# Patient Record
Sex: Female | Born: 1950 | Race: White | Hispanic: No | Marital: Single | Smoking: Never smoker
Health system: Southern US, Community
[De-identification: ages and names within clinical notes are randomized; demographics above are authoritative.]

## PROBLEM LIST (undated history)

## (undated) DIAGNOSIS — F419 Anxiety disorder, unspecified: Secondary | ICD-10-CM

## (undated) DIAGNOSIS — G4700 Insomnia, unspecified: Secondary | ICD-10-CM

## (undated) DIAGNOSIS — R011 Cardiac murmur, unspecified: Secondary | ICD-10-CM

## (undated) DIAGNOSIS — I10 Essential (primary) hypertension: Secondary | ICD-10-CM

## (undated) DIAGNOSIS — E039 Hypothyroidism, unspecified: Secondary | ICD-10-CM

## (undated) HISTORY — PX: COSMETIC SURGERY: SHX468

## (undated) HISTORY — PX: OTHER SURGICAL HISTORY: SHX169

## (undated) HISTORY — PX: EYE SURGERY: SHX253

## (undated) HISTORY — PX: AUGMENTATION MAMMAPLASTY: SUR837

---

## 2000-04-30 ENCOUNTER — Encounter: Payer: Self-pay | Admitting: Specialist

## 2000-04-30 ENCOUNTER — Encounter: Admission: RE | Admit: 2000-04-30 | Discharge: 2000-04-30 | Payer: Self-pay | Admitting: Specialist

## 2000-07-13 ENCOUNTER — Encounter: Payer: Self-pay | Admitting: Specialist

## 2000-07-13 ENCOUNTER — Encounter: Admission: RE | Admit: 2000-07-13 | Discharge: 2000-07-13 | Payer: Self-pay | Admitting: Specialist

## 2000-09-24 ENCOUNTER — Emergency Department (HOSPITAL_COMMUNITY): Admission: EM | Admit: 2000-09-24 | Discharge: 2000-09-24 | Payer: Self-pay | Admitting: Emergency Medicine

## 2000-12-07 ENCOUNTER — Encounter: Payer: Self-pay | Admitting: Emergency Medicine

## 2000-12-07 ENCOUNTER — Emergency Department (HOSPITAL_COMMUNITY): Admission: EM | Admit: 2000-12-07 | Discharge: 2000-12-07 | Payer: Self-pay | Admitting: Emergency Medicine

## 2002-11-02 ENCOUNTER — Encounter
Admission: RE | Admit: 2002-11-02 | Discharge: 2003-01-31 | Payer: Self-pay | Admitting: Physical Medicine & Rehabilitation

## 2003-10-15 ENCOUNTER — Encounter: Admission: RE | Admit: 2003-10-15 | Discharge: 2003-10-15 | Payer: Self-pay | Admitting: Pain Medicine

## 2003-11-19 ENCOUNTER — Encounter: Admission: RE | Admit: 2003-11-19 | Discharge: 2003-11-19 | Payer: Self-pay | Admitting: Pain Medicine

## 2004-01-21 ENCOUNTER — Ambulatory Visit: Payer: Self-pay | Admitting: Pain Medicine

## 2004-01-31 ENCOUNTER — Ambulatory Visit: Payer: Self-pay | Admitting: Pain Medicine

## 2004-02-18 ENCOUNTER — Ambulatory Visit: Payer: Self-pay | Admitting: Physician Assistant

## 2004-04-07 ENCOUNTER — Ambulatory Visit: Payer: Self-pay | Admitting: Pain Medicine

## 2004-04-07 ENCOUNTER — Encounter: Admission: RE | Admit: 2004-04-07 | Discharge: 2004-04-07 | Payer: Self-pay | Admitting: Pain Medicine

## 2004-05-26 ENCOUNTER — Ambulatory Visit: Payer: Self-pay | Admitting: Pain Medicine

## 2004-06-17 ENCOUNTER — Ambulatory Visit: Payer: Self-pay | Admitting: Pain Medicine

## 2004-06-23 ENCOUNTER — Ambulatory Visit: Payer: Self-pay | Admitting: Physician Assistant

## 2004-07-23 ENCOUNTER — Ambulatory Visit: Payer: Self-pay | Admitting: Physician Assistant

## 2004-09-13 ENCOUNTER — Emergency Department (HOSPITAL_COMMUNITY): Admission: AD | Admit: 2004-09-13 | Discharge: 2004-09-13 | Payer: Self-pay | Admitting: Family Medicine

## 2004-09-16 ENCOUNTER — Ambulatory Visit: Payer: Self-pay | Admitting: Internal Medicine

## 2004-09-17 ENCOUNTER — Emergency Department (HOSPITAL_COMMUNITY): Admission: EM | Admit: 2004-09-17 | Discharge: 2004-09-17 | Payer: Self-pay | Admitting: Emergency Medicine

## 2004-09-22 ENCOUNTER — Emergency Department (HOSPITAL_COMMUNITY): Admission: EM | Admit: 2004-09-22 | Discharge: 2004-09-22 | Payer: Self-pay | Admitting: Family Medicine

## 2004-10-16 ENCOUNTER — Ambulatory Visit: Payer: Self-pay | Admitting: Internal Medicine

## 2004-11-19 ENCOUNTER — Emergency Department (HOSPITAL_COMMUNITY): Admission: EM | Admit: 2004-11-19 | Discharge: 2004-11-19 | Payer: Self-pay | Admitting: Family Medicine

## 2004-11-19 ENCOUNTER — Ambulatory Visit: Payer: Self-pay | Admitting: Internal Medicine

## 2004-11-24 ENCOUNTER — Emergency Department (HOSPITAL_COMMUNITY): Admission: EM | Admit: 2004-11-24 | Discharge: 2004-11-24 | Payer: Self-pay | Admitting: Emergency Medicine

## 2004-12-22 ENCOUNTER — Emergency Department (HOSPITAL_COMMUNITY): Admission: EM | Admit: 2004-12-22 | Discharge: 2004-12-22 | Payer: Self-pay | Admitting: Family Medicine

## 2004-12-24 ENCOUNTER — Ambulatory Visit: Payer: Self-pay | Admitting: Internal Medicine

## 2004-12-31 ENCOUNTER — Encounter: Payer: Self-pay | Admitting: Internal Medicine

## 2005-02-18 ENCOUNTER — Ambulatory Visit: Payer: Self-pay | Admitting: Internal Medicine

## 2005-03-06 ENCOUNTER — Encounter: Payer: Self-pay | Admitting: Emergency Medicine

## 2005-03-07 ENCOUNTER — Inpatient Hospital Stay (HOSPITAL_COMMUNITY): Admission: EM | Admit: 2005-03-07 | Discharge: 2005-03-11 | Payer: Self-pay | Admitting: Internal Medicine

## 2005-12-15 ENCOUNTER — Observation Stay (HOSPITAL_COMMUNITY): Admission: EM | Admit: 2005-12-15 | Discharge: 2005-12-16 | Payer: Self-pay | Admitting: Emergency Medicine

## 2009-06-16 ENCOUNTER — Inpatient Hospital Stay (HOSPITAL_COMMUNITY): Admission: EM | Admit: 2009-06-16 | Discharge: 2009-06-18 | Payer: Self-pay | Admitting: Emergency Medicine

## 2009-06-16 ENCOUNTER — Ambulatory Visit: Payer: Self-pay | Admitting: Cardiology

## 2009-06-16 ENCOUNTER — Ambulatory Visit: Payer: Self-pay | Admitting: Pulmonary Disease

## 2009-06-16 ENCOUNTER — Encounter: Payer: Self-pay | Admitting: Internal Medicine

## 2009-06-17 ENCOUNTER — Encounter: Payer: Self-pay | Admitting: Cardiology

## 2009-06-18 DIAGNOSIS — F39 Unspecified mood [affective] disorder: Secondary | ICD-10-CM

## 2010-03-31 LAB — CARDIAC PANEL(CRET KIN+CKTOT+MB+TROPI)
CK, MB: 0.9 ng/mL (ref 0.3–4.0)
CK, MB: 1.6 ng/mL (ref 0.3–4.0)
Relative Index: 1.1 (ref 0.0–2.5)
Relative Index: INVALID (ref 0.0–2.5)
Total CK: 36 U/L (ref 7–177)
Total CK: 60 U/L (ref 7–177)
Troponin I: 0.03 ng/mL (ref 0.00–0.06)
Troponin I: 0.05 ng/mL (ref 0.00–0.06)

## 2010-03-31 LAB — RETICULOCYTES
RBC.: 5.25 MIL/uL — ABNORMAL HIGH (ref 3.87–5.11)
Retic Count, Absolute: 110.3 10*3/uL (ref 19.0–186.0)
Retic Ct Pct: 2.1 % (ref 0.4–3.1)

## 2010-03-31 LAB — HEPATIC FUNCTION PANEL
ALT: 301 U/L — ABNORMAL HIGH (ref 0–35)
ALT: 463 U/L — ABNORMAL HIGH (ref 0–35)
AST: 127 U/L — ABNORMAL HIGH (ref 0–37)
AST: 353 U/L — ABNORMAL HIGH (ref 0–37)
Albumin: 2.8 g/dL — ABNORMAL LOW (ref 3.5–5.2)
Alkaline Phosphatase: 103 U/L (ref 39–117)
Alkaline Phosphatase: 117 U/L (ref 39–117)
Bilirubin, Direct: 0.2 mg/dL (ref 0.0–0.3)
Indirect Bilirubin: 0.5 mg/dL (ref 0.3–0.9)
Total Protein: 4.9 g/dL — ABNORMAL LOW (ref 6.0–8.3)

## 2010-03-31 LAB — COMPREHENSIVE METABOLIC PANEL
ALT: 605 U/L — ABNORMAL HIGH (ref 0–35)
AST: 825 U/L — ABNORMAL HIGH (ref 0–37)
Albumin: 3.5 g/dL (ref 3.5–5.2)
Alkaline Phosphatase: 157 U/L — ABNORMAL HIGH (ref 39–117)
BUN: 39 mg/dL — ABNORMAL HIGH (ref 6–23)
CO2: 21 mEq/L (ref 19–32)
Calcium: 10.4 mg/dL (ref 8.4–10.5)
Chloride: 106 mEq/L (ref 96–112)
Creatinine, Ser: 1.95 mg/dL — ABNORMAL HIGH (ref 0.4–1.2)
GFR calc Af Amer: 32 mL/min — ABNORMAL LOW (ref >60)
GFR calc non Af Amer: 26 mL/min — ABNORMAL LOW (ref >60)
Glucose, Bld: 118 mg/dL — ABNORMAL HIGH (ref 70–99)
Potassium: 4.6 mEq/L (ref 3.5–5.1)
Sodium: 136 mEq/L (ref 135–145)
Total Bilirubin: 2.9 mg/dL — ABNORMAL HIGH (ref 0.3–1.2)
Total Protein: 5.9 g/dL — ABNORMAL LOW (ref 6.0–8.3)

## 2010-03-31 LAB — DIFFERENTIAL
Basophils Absolute: 0 10*3/uL (ref 0.0–0.1)
Basophils Relative: 0 % (ref 0–1)
Eosinophils Absolute: 0 10*3/uL (ref 0.0–0.7)
Eosinophils Relative: 0 % (ref 0–5)
Lymphocytes Relative: 27 % (ref 12–46)
Lymphs Abs: 3.2 10*3/uL (ref 0.7–4.0)
Monocytes Absolute: 0.2 10*3/uL (ref 0.1–1.0)
Monocytes Relative: 2 % — ABNORMAL LOW (ref 3–12)
Neutro Abs: 8.5 10*3/uL — ABNORMAL HIGH (ref 1.7–7.7)
Neutrophils Relative %: 71 % (ref 43–77)

## 2010-03-31 LAB — BLOOD GAS, ARTERIAL
Acid-base deficit: 3.4 mmol/L — ABNORMAL HIGH (ref 0.0–2.0)
Drawn by: 29603
PEEP: 5 cmH2O
Patient temperature: 98.6
TCO2: 23.6 mmol/L (ref 0–100)
pCO2 arterial: 47.2 mmHg — ABNORMAL HIGH (ref 35.0–45.0)
pH, Arterial: 7.293 — ABNORMAL LOW (ref 7.350–7.400)

## 2010-03-31 LAB — PROTIME-INR
INR: 1.22 (ref 0.00–1.49)
Prothrombin Time: 15.3 seconds — ABNORMAL HIGH (ref 11.6–15.2)

## 2010-03-31 LAB — GLUCOSE, CAPILLARY: Glucose-Capillary: 233 mg/dL — ABNORMAL HIGH (ref 70–99)

## 2010-03-31 LAB — POCT CARDIAC MARKERS
CKMB, poc: <1 ng/mL — ABNORMAL LOW (ref 1.0–8.0)
Myoglobin, poc: 104 ng/mL (ref 12–200)
Troponin i, poc: <0.05 ng/mL (ref 0.00–0.09)

## 2010-03-31 LAB — CBC
HCT: 27.9 % — ABNORMAL LOW (ref 36.0–46.0)
Hemoglobin: 9.4 g/dL — ABNORMAL LOW (ref 12.0–15.0)
MCHC: 33.7 g/dL (ref 30.0–36.0)
MCV: 82.8 fL (ref 78.0–100.0)
Platelets: 109 10*3/uL — ABNORMAL LOW (ref 150–400)
Platelets: 117 10*3/uL — ABNORMAL LOW (ref 150–400)
RBC: 3.37 MIL/uL — ABNORMAL LOW (ref 3.87–5.11)
RBC: 4.16 MIL/uL (ref 3.87–5.11)
RDW: 13.4 % (ref 11.5–15.5)
WBC: 10.6 10*3/uL — ABNORMAL HIGH (ref 4.0–10.5)
WBC: 12 10*3/uL — ABNORMAL HIGH (ref 4.0–10.5)

## 2010-03-31 LAB — BASIC METABOLIC PANEL
BUN: 24 mg/dL — ABNORMAL HIGH (ref 6–23)
Calcium: 8.5 mg/dL (ref 8.4–10.5)
Creatinine, Ser: 0.76 mg/dL (ref 0.4–1.2)
GFR calc Af Amer: >60 mL/min (ref >60)
GFR calc non Af Amer: >60 mL/min (ref >60)

## 2010-03-31 LAB — IRON AND TIBC
Iron: 240 ug/dL — ABNORMAL HIGH (ref 42–135)
Saturation Ratios: 75 % — ABNORMAL HIGH (ref 20–55)
TIBC: 318 ug/dL (ref 250–470)
UIBC: 78 ug/dL

## 2010-03-31 LAB — RAPID URINE DRUG SCREEN, HOSP PERFORMED
Amphetamines: NOT DETECTED
Barbiturates: NOT DETECTED
Benzodiazepines: POSITIVE — AB
Cocaine: NOT DETECTED
Opiates: NOT DETECTED
Tetrahydrocannabinol: NOT DETECTED

## 2010-03-31 LAB — APTT
aPTT: 26 seconds (ref 24–37)
aPTT: 30 seconds (ref 24–37)

## 2010-03-31 LAB — MAGNESIUM: Magnesium: 2.3 mg/dL (ref 1.5–2.5)

## 2010-03-31 LAB — POCT I-STAT, CHEM 8
BUN: 22 mg/dL (ref 6–23)
Calcium, Ion: 1.1 mmol/L — ABNORMAL LOW (ref 1.12–1.32)
Chloride: 117 mEq/L — ABNORMAL HIGH (ref 96–112)
Creatinine, Ser: 1.5 mg/dL — ABNORMAL HIGH (ref 0.4–1.2)
Glucose, Bld: 90 mg/dL (ref 70–99)
HCT: 24 % — ABNORMAL LOW (ref 36.0–46.0)
Hemoglobin: 8.2 g/dL — ABNORMAL LOW (ref 12.0–15.0)
Potassium: 2.9 mEq/L — ABNORMAL LOW (ref 3.5–5.1)
Sodium: 145 mEq/L (ref 135–145)
TCO2: 10 mmol/L (ref 0–100)

## 2010-03-31 LAB — MRSA PCR SCREENING: MRSA by PCR: POSITIVE — AB

## 2010-03-31 LAB — VITAMIN B12: Vitamin B-12: 1090 pg/mL — ABNORMAL HIGH (ref 211–911)

## 2010-03-31 LAB — DRUG SCREEN PANEL (SERUM)

## 2010-03-31 LAB — LIPID PANEL
Cholesterol: 81 mg/dL (ref 0–200)
LDL Cholesterol: 24 mg/dL (ref 0–99)
VLDL: 24 mg/dL (ref 0–40)

## 2010-03-31 LAB — TSH: TSH: 0.006 u[IU]/mL — ABNORMAL LOW (ref 0.350–4.500)

## 2010-03-31 LAB — FERRITIN: Ferritin: 5700 ng/mL — ABNORMAL HIGH (ref 10–291)

## 2010-03-31 LAB — FOLATE: Folate: 17.3 ng/mL

## 2010-05-30 NOTE — H&P (Signed)
NAMELorel Ward                ACCOUNT NO.:  [id number]   MEDICAL RECORD NO.:  [id number]          PATIENT TYPE:  EMS   LOCATION:  ED                           FACILITY:  [facility]   PHYSICIAN:  Hollice Espy, M.D.DATE OF BIRTH:  02-03-1950   DATE OF ADMISSION:  12/15/2005  DATE OF DISCHARGE:                              HISTORY & PHYSICAL   PRIMARY CARE PHYSICIAN:  C. Duane Lope, M.D.   CHIEF COMPLAINT:  Diarrhea.   HISTORY OF PRESENT ILLNESS:  The patient is a 60 year old white female  with a past medical history of hypertension and depression, who presents  to the emergency room after several days of diarrhea.  She had stated  that she had been unable to keep anything down, and when she had labs  checked, more concerning was a potassium of 8.7 with a normal BUN and  creatinine.  Repeat labs confirmed this elevated level, and the patient  was given 20 units of Regular insulin plus one ampule of D-50, as well  as a dose of sodium bicarbonate and one ampule of calcium chloride.  She  currently is feeling okay.   REVIEW OF SYSTEMS:  She denies any headaches, vision changes, dysphagia,  chest pain, palpitations, shortness of breath, wheezing, coughing,  abdominal pain, hematuria, dysuria or constipation.  No focal extremity  numbness, weakness or pain.  A full review of systems is otherwise  negative.   PAST MEDICAL HISTORY:  1. Hypertension.  2. Depression.   MEDICATIONS:  1. She takes potassium with K-Dur 20 mEq, and according to the patient      she takes four to five pills a day, which is equal to 80 mEq to 100      mEq of potassium.  2. Lisinopril 20 mg p.o. daily.  3. Atenolol p.o. daily.  4. Methadone 10 mg p.o. daily.  5. Cymbalta p.o. daily.   ALLERGIES:  PENICILLIN.   SOCIAL HISTORY:  She denies any tobacco, excessive alcohol or drug use.   FAMILY HISTORY:  Noncontributory.   PHYSICAL EXAMINATION:  VITAL SIGNS:  On admission temperature 97.4  degrees,  heart rate 98, now down to 67, blood pressure 152/101, now down  to 129/89, respirations 22, O2 saturation 99% on room air.  GENERAL:  The patient is alert and oriented x3, in no apparent distress.  HEENT:  Normocephalic and atraumatic.  Mucous membranes are moist.  NECK:  She has no carotid bruits.  HEART:  A regular rate and rhythm.  S1, S2.  A 2/6 systolic ejection  murmur.  LUNGS:  Clear to auscultation bilaterally.  ABDOMEN:  Soft, nontender, non-distended.  Positive bowel sounds.  EXTREMITIES:  No clubbing, cyanosis or edema.   LABORATORY DATA:  A urinalysis shows a trace of hemoglobin.  She has 30  of protein, otherwise normal.  Sodium 134, potassium 8.7, chloride 112,  bicarbonate 20, BUN 24, creatinine 0.9, glucose 127, calcium 9.7.  Urinalysis shows many epithelial cells but only 0-2 red cells.  Confirmed potassium is 8.9.   She has peak T-waves reportedly on her electrocardiogram, although I do  not have it in front of me at this time.   ASSESSMENT/PLAN:  1. Hyperkalemia:  This is likely caused by the patient's excessive use      of potassium.  It is unclear if the patient misunderstood the      dosing, but clearly taking 80 mEq to 100 mEq of potassium daily      will certainly lead to excess.  It is unclear also as to how long      she has been doing this.  She was also noted to be on an ACE      inhibitor, but she is not in any type of renal failure for some      time.  Would favor for now obviously holding it, but I do not see      long-term why she cannot continue on this medication, although I      would likely discontinue her potassium supplement altogether.  Will      start her on Kayexalate.  She has already received a dose of      bicarbonate, calcium carbonate and insulin plus glucose.  Will      repeat levels here shortly, and if need to continue aggressive      insulin and glucose treatments plus albuterol treatments, in      regards to getting her potassium  down further short-term, while      Kayexalate will take its time to work.  Will repeat her labs also      in the morning, and if her labs are better, will possibly consider      discharge.  2. Hypertension:  Again, down the line the patient can resume her ACE      inhibitor.  Will continue on her atenolol, but for now will hold      these medications.  3. Diarrhea, likely gastroenteritis:  The patient has no recent use of      antibiotic, but for now we told her to expect diarrhea with the      Kayexalate.  She appears to be well-hydrated.  Will continue      aggressive IV hydration in attempts to diurese her potassium      further.  4. Depression:  Continue Cymbalta.  5. Chronic back pain:  Continue methadone.      Hollice Espy, M.D.  Electronically Signed     SKK/MEDQ  D:  12/15/2005  T:  12/15/2005  Job:  [id number]   cc:   C. Duane Lope, M.D.  Fax: [phone]

## 2010-05-30 NOTE — Discharge Summary (Signed)
NAMELorel Ward                ACCOUNT NO.:  [id number]   MEDICAL RECORD NO.:  [id number]          PATIENT TYPE:  INP   LOCATION:  3031                         FACILITY:  [facility]   PHYSICIAN:  Lonia Blood, M.D.       DATE OF BIRTH:  1950/08/08   DATE OF ADMISSION:  03/07/2005  DATE OF DISCHARGE:  03/11/2005                                 DISCHARGE SUMMARY   DISCHARGE DIAGNOSES:  1.  Acute on chronic right frontal sinusitis with preseptal cellulitis.  2.  Fibromyalgia.  3.  Depression with anxiety.  4.  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  5.  Vitiligo.  6.  Insomnia.   DISCHARGE MEDICATIONS:  1.  Augmentin 875 mg p.o. b.i.d. for six weeks.  2.  Atenolol 100 mg p.o. daily.  3.  Trazodone 300 mg at bedtime.  4.  Cymbalta 60 mg p.o. daily.  5.  Requip 0.25 mg p.o. at bedtime.  6.  MiraLax 17 gm p.o. daily.  7.  Klonopin 2 mg p.o. at bedtime.  8.  Vicodin 5/500 one to two q.6h. p.r.n. pain.   CONDITION ON DISCHARGE:  [name] was discharged in fair condition.   DISCHARGE FOLLOWUP:  Follow up with Dr. Ezzard Standing, phone number [phone]; she  is also instructed to follow up with primary care physician, Dr. Karilyn Cota.   PROCEDURE DURING THIS ADMISSION:  On March 08, 2005, the patient  underwent functional endoscopic sinus surgery with bilateral  nasal frontal  duct exploration by Dr. Ezzard Standing.   CONSULTATIONS THIS ADMISSION:  Dr. Ezzard Standing from otorhinolaryngology and also  Dr. Orvan Falconer from infectious diseases.   For admission history and physical, please refer to the dictated history and  physical done by Dr. Roxan Hockey on March 07, 2005.   HOSPITAL COURSE:  Problem #1:  Right eye preseptal cellulitis with frontal  sinusitis.  [name] was kept on empiric intravenous vancomycin. She had  computer tomography of her sinuses confirming the presence of worsening  sinus disease. She underwent endoscopic surgery on March 08, 2005, by Dr.  Ezzard Standing. She recovered and because of that we have consulted  infectious  disease service to help with the choice of empiric antibiotics.   Problem #2:  Fibromyalgia, depression, and anxiety. This has been stable  throughout the hospitalization. [name] was kept on her chronic doses of  trazodone, Cymbalta, Requip.   Problem #3:  Chronic constipation. [name] had repeat doses of MiraLax in  the hospital and she also received a Fleet's enema.      Lonia Blood, M.D.  Electronically Signed     SL/MEDQ  D:  03/11/2005  T:  03/11/2005  Job:  [id number]   cc:   Kristine Garbe. Ezzard Standing, M.D.  Fax: [phone]

## 2010-05-30 NOTE — Op Note (Signed)
NAMELorel Ward                ACCOUNT NO.:  [id number]   MEDICAL RECORD NO.:  [id number]          PATIENT TYPE:  INP   LOCATION:  3031                         FACILITY:  [facility]   PHYSICIAN:  Kristine Garbe. 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   OPERATION PERFORMED:  Functional endoscopic sinus surgery utilizing the  Stealth guidance system.  Bilateral nasal frontal duct exploration.  Culture  irrigation of right sinus trephine osteomyelitis.   SURGEON:  Kristine Garbe. Ezzard Standing, M.D.   ANESTHESIA:  General.   COMPLICATIONS:  None.   INDICATIONS FOR PROCEDURE:  Gloria Ward is a 60 year old female who has had  previous sinus surgery x3 15 years ago.  More recently she has been having  problems with headaches and recurrent right periorbital swelling.  This has  responded previously to antibiotics but more recently has had increased  swelling over the right eyelid and developed a purulent discharge just in  the superior aspect of the right eyelid.  CT scan shows a right frontal  sinus osteomyelitis with erosion through the anterior inferior wall of the  frontal sinus.  She also has complete opacification of the left frontal  sinus.  She is taken to the operating room at this time for drainage,  irrigation and culture of the right frontal sinus along with bilateral nasal  frontal duct explorations.   DESCRIPTION OF PROCEDURE:  The patient was brought to the operating room.  The Stealth system was placed on her head and calibrated.  Nose was prepped  with Betadine solution.  Nose was then further prepped with cotton pledgets  soaked in Afrin and the nose was  injected with Xylocaine with epinephrine  for hemostasis.  Using the Stealth system, the lower portion of the nasal  frontal duct was obstructed bilaterally.  The more mid and posterior ethmoid  area were widely patent and I could visualize the ethmoid roof which was  clear of any disease.  Working anteriorly, the anterior ethmoid and nasal  frontal duct area, large thru-cut forceps were used to open up this bone.  The mucosa was very thickened, especially on the right side, she had a very  small narrow nasal frontal duct area.  I was able to pass the curved suction  up the nasal frontal duct area and utilizing the Stealth system, got to the  superior aspect of the nasal frontal duct region bilaterally.  Next, the  fistula tract in the right upper eyelid was explored.  The wound was  enlarged slightly and the opening of the frontal sinus was identified.  Using curets and rongeurs, this opening was enlarged to approximately 1 cm  size.  Cultures were obtained from the purulent drainage.  The sinus that  could be visualized was irrigated with saline but on irrigation it really  was not clear whether  any of this irrigation was getting through down  through the nasal frontal duct region.  After irrigating the sinus, a  Penrose drain was placed with the sinus and sutured to the upper eyelid.  Using the 30 degree scopes, the nose was examined again and the nasal  frontal areas were opened up as far superiorly as possible using the 30  degree scopes.  Kennedy sinus packs were placed within the middle meatus  area bilaterally and hydrated with Xylocaine with epinephrine.  Rachael was  awakened from anesthesia and transferred to recovery room postoperatively  doing well.   DISPOSITION:  Rachael will stay in the hospital for another couple of days  on IV antibiotics.  Will probably plan on keeping the patient on antibiotics  for four to six weeks.  Will plan on removing the Kennedy sinus packs in   five to six days and the Penrose drain at the same time.           ______________________________  Kristine Garbe Ezzard Standing, M.D.     CEN/MEDQ  D:  03/08/2005  T:  03/09/2005  Job:  [id number]

## 2010-05-30 NOTE — Consult Note (Signed)
NAMELorel Ward                ACCOUNT NO.:  [id number]   MEDICAL RECORD NO.:  [id number]          PATIENT TYPE:  INP   LOCATION:  3031                         FACILITY:  [facility]   PHYSICIAN:  Kristine Garbe. Ezzard Standing, M.D.DATE OF BIRTH:  January 10, 1951   DATE OF CONSULTATION:  03/07/2005  DATE OF DISCHARGE:                                   CONSULTATION   REASON FOR CONSULTATION:  Evaluate patient with right frontal sinus  osteomyelitis.   BRIEF HISTORY:  [name] is a 61 year old female who had previous sinus  surgery x3, 15 years ago in [place]. Has had problems with chronic  headaches and more recently has had intermittent swelling of her right eye.  This has been treated with antibiotics and it goes down previously when  treated with antibiotics but over the last week and a half, it has been  increasing and then started draining about a week ago with a purulent  discharge from the right upper eyelid. She was admitted to the hospital for  IV antibiotics and a CT scan demonstrated right frontal sinus osteomyelitis  with a bony defect anteriorly, draining or fistulizing through the right  upper eyelid. She also has complete obstruction of the nasal fold duct  regions bilaterally with opacification of the left frontal sinus as well.   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   RECOMMENDATIONS:  Culture has been obtained. Treat with appropriate  intravenous antibiotics per culture. Will require surgical intervention to  open up the natural nasal frontal duct regions so the sinuses can drain  internally. Will plan on taking the patient to the operating room tomorrow  for functional endoscopic sinus surgery, utilizing the Stealth guidant  system and drainage of the frontal sinuses. I discussed this and risks to  the patient and she agrees with the surgery.           ______________________________  Kristine Garbe Ezzard Standing, M.D.     CEN/MEDQ  D:  03/07/2005  T:  03/08/2005  Job:  [id number]   cc:   Kristine Garbe. Ezzard Standing, M.D.  Fax: [phone]

## 2010-05-30 NOTE — H&P (Signed)
NAMECarmelia Ward NO.:  [id number]   MEDICAL RECORD NO.:  [id number]          PATIENT TYPE:  EMS   LOCATION:  ED                           FACILITY:  [facility]   PHYSICIAN:  Gloria Ward, M.D. DATE OF BIRTH:  06-10-1950   DATE OF ADMISSION:  03/06/2005  DATE OF DISCHARGE:                                HISTORY & PHYSICAL   PRIMARY CARE PHYSICIAN:  Unassigned.   CHIEF COMPLAINT:  Right-sided face pain.   HISTORY OF PRESENT ILLNESS:  Ms. Gloria Ward is a 60 year old female with a past  medical history of hypertension who states that approximately 2 weeks ago  her right eye started to swell.  She went on to state that she has had  problems with this at least twice before.  However, each time the eye would  swell, it would spontaneously improve on its own.  This time is different.  There has been no resolution of her current eye swelling, and, in fact, it  has progressed to the point that the whole right side of her face has  started to swell.  She states that her right eye has closed secondary to the  swelling, and it has been closed for approximately 2 weeks.  She developed  discharge from her tear duct several days ago.  Yesterday she took 2  ciprofloxacin tablets that had been prescribed for something else and were  in her cabinet.  Shortly afterwards, she began to apply pressure to her  right eyelid.  A large amount of pus was released from the upper eyelid.  She also complains of pain throughout the entire right side of her face.  She denies nausea and vomiting but does state that she has felt febrile and  has had chills.  She went to the Urgent Care today and was told to come to  the ER.  She denies any prior trauma to the right eye.   PAST MEDICAL HISTORY:  1.  Right eye swelling 1 month ago.  The patient states that she was seen at      [facility] Emergency Department but states that nothing was done, and      she was sent home and told to follow up at the  Urgent Care.  2.  Ruptured, herniated disk at C6-C7.  3.  Wide bulging disk involving the lumbosacral spine.  4.  Vitiligo.  5.  Fibromyalgia.  6.  Rheumatoid arthritis.  7.  Insomnia.   PAST SURGICAL HISTORY:  1.  C section x2.  2.  Sinus surgery.   ALLERGIES:  1.  PENICILLIN causes the patient to feel hot.  2.  STEROIDS cause swelling.   HOME MEDICATIONS:  1.  Cymbalta.  2.  Tenormin 100 mg p.o. daily.  3.  Prilosec.  4.  Multivitamins.  5.  Lasix.  The patient uses this on a p.r.n. basis.  6.  Antihistamines.  7.  Trazodone 150 mg 3 tablets daily.  8.  Klonopin 3 mg 3 tablets daily.  9.  Soma.   SOCIAL HISTORY:  1.  Cigarettes: The patient smoked  while in her 90s, but she no longer      smokes cigarettes.  2.  Alcohol: The patient denies.   FAMILY HISTORY:  Mother had history of colorectal cancer, breast cancer,  alcoholism.  Father had a history of lung cancer, chronic pancreatitis, and  hypertension.   REVIEW OF SYSTEMS:  As per HPI.   PHYSICAL EXAMINATION:  GENERAL: The patient is awake, cooperative, no  obvious distress.  VITAL SIGNS: Temperature 97.7, blood pressure 135/87, heart rate 67,  respirations 20, O2 saturation 95% on room air.  HEENT:  The right half of the patient's face is slightly erythematous,  primarily the lower half of her face.  The right upper and lower eyelids are  erythematous; both are significantly swollen to the point that the patient  cannot open her eye.  There is a purulent yellow discharge coming from the  crease of the upper right eyelid.  There also appears to be dry residue from  the nasal lacrimal duct.  The patient's left eye is uninvolved; it is  anicteric.  Extraocular movement is intact.  Dentures are present in upper  and lower areas.  No thrush.  NECK:  Supple.  No lymphadenopathy.  Thyroid is not palpable.  CARDIAC: S1, S2 present.  Regular rate and rhythm.  RESPIRATORY: No crackles or wheezes.  ABDOMEN: Soft,  nontender, nondistended.  Positive bowel sounds.  No masses.  EXTREMITIES:  No edema.  NEUROLOGIC: The patient is alert and oriented x3.  Cranial nerves II-XII  intact excluding cranial nerves II, III, IV, and VI involving the right eye;  those cannot be assessed secondary to the swelling and the fact that the eye  was closed shut.  MUSCULOSKELETAL: 5/5 upper and lower extremity strength.   CT of the head and face reveals right preseptal cellulitis, probable  prominent right lacrimal apparatus, possible inflammatory, doubt abscess.  Bony defect in the right superior orbital rim which communicates with the  frontal sinus.  Suspect osteomyelitis with a right frontal ethmoid and right  maxillary sinusitis as well.   Labs: White blood cells 10.3, hemoglobin 12.8, hematocrit 38.2, platelets  240.  Sodium 134, potassium 4.1, chloride 98, CO2 32, BUN 17, creatinine  0.8, glucose 77, calcium 9.   ASSESSMENT AND PLAN:  1.  Right eye cellulitis with probable osteomyelitis and abscess of the      right eyelid.  Will continue empiric IV antibiotics for now.  Will check      cultures of that eyelid and will provide p.r.n. pain medications.  Will      also consult ENT.  2.  Hypertension. This is currently stable.  Will continue atenolol.  3.  History of fibromyalgia.  Will monitor this for now.  4.  History of rheumatoid arthritis.  The patient has no current complaints;      therefore, will monitor.  5.  Gastrointestinal prophylaxis.  Will provide Protonix.      Gloria Ward, M.D.  Electronically Signed     OR/MEDQ  D:  03/07/2005  T:  03/07/2005  Job:  [id number]

## 2011-06-02 IMAGING — CR DG CHEST 1V PORT
1 series · 1 of 1 positions shown · non-contrast
Comparison: None.

CLINICAL DATA: Endotracheal tube and C L placement

PORTABLE CHEST - 1 VIEW

[AP]
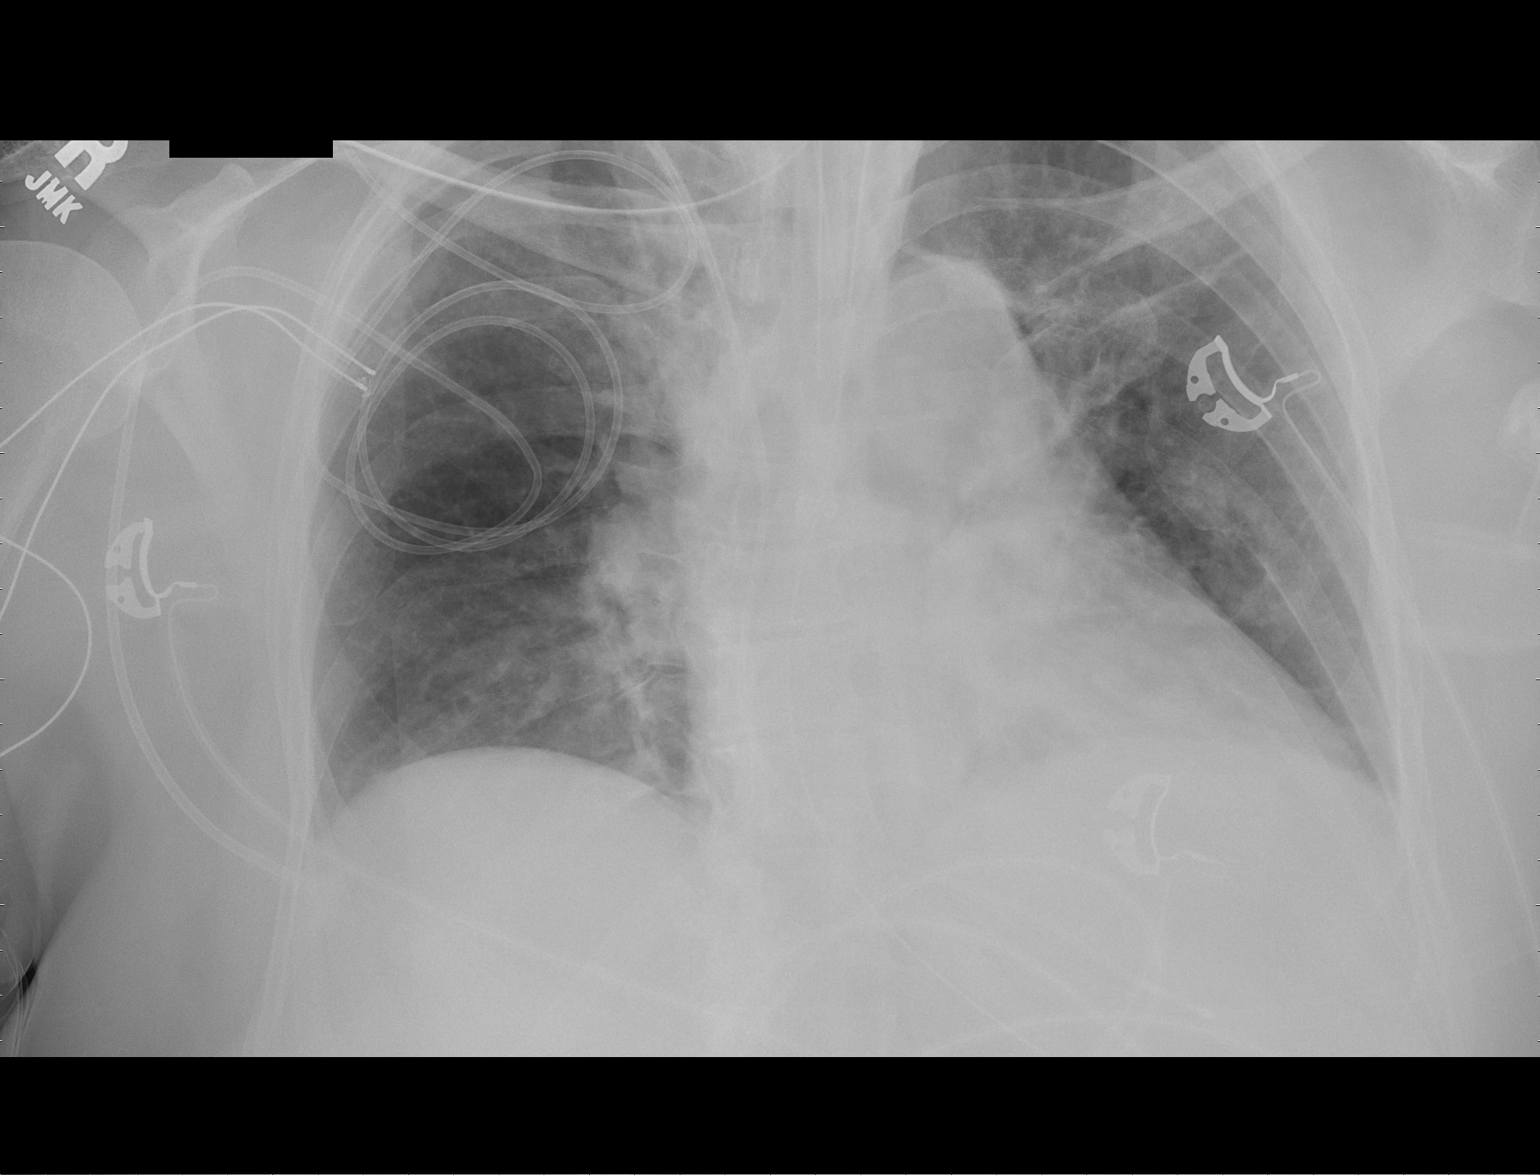

[1 of 1 positions shown; findings below may reference images not displayed]

FINDINGS: Endotracheal tube is appropriately positioned.
Nasogastric tube terminates below the level of the diaphragms but
the tip is not included on the film. Right IJ approach presumed
pacer lead terminates over the expected location of the right
ventricle.  Mild cardiomegaly noted. Lung volumes are low with
crowding of the bronchovascular markings. Retrocardiac opacity may
represent atelectasis, less likely pneumonia given the clinical
history. No pneumothorax.
IMPRESSION: Support apparatus as above.

## 2011-06-03 IMAGING — CR DG CHEST 1V PORT
1 series · 1 of 1 positions shown · non-contrast
Comparison: 1 day prior

CLINICAL DATA: Respiratory failure.  Heart block.

PORTABLE CHEST - 1 VIEW

[AP]
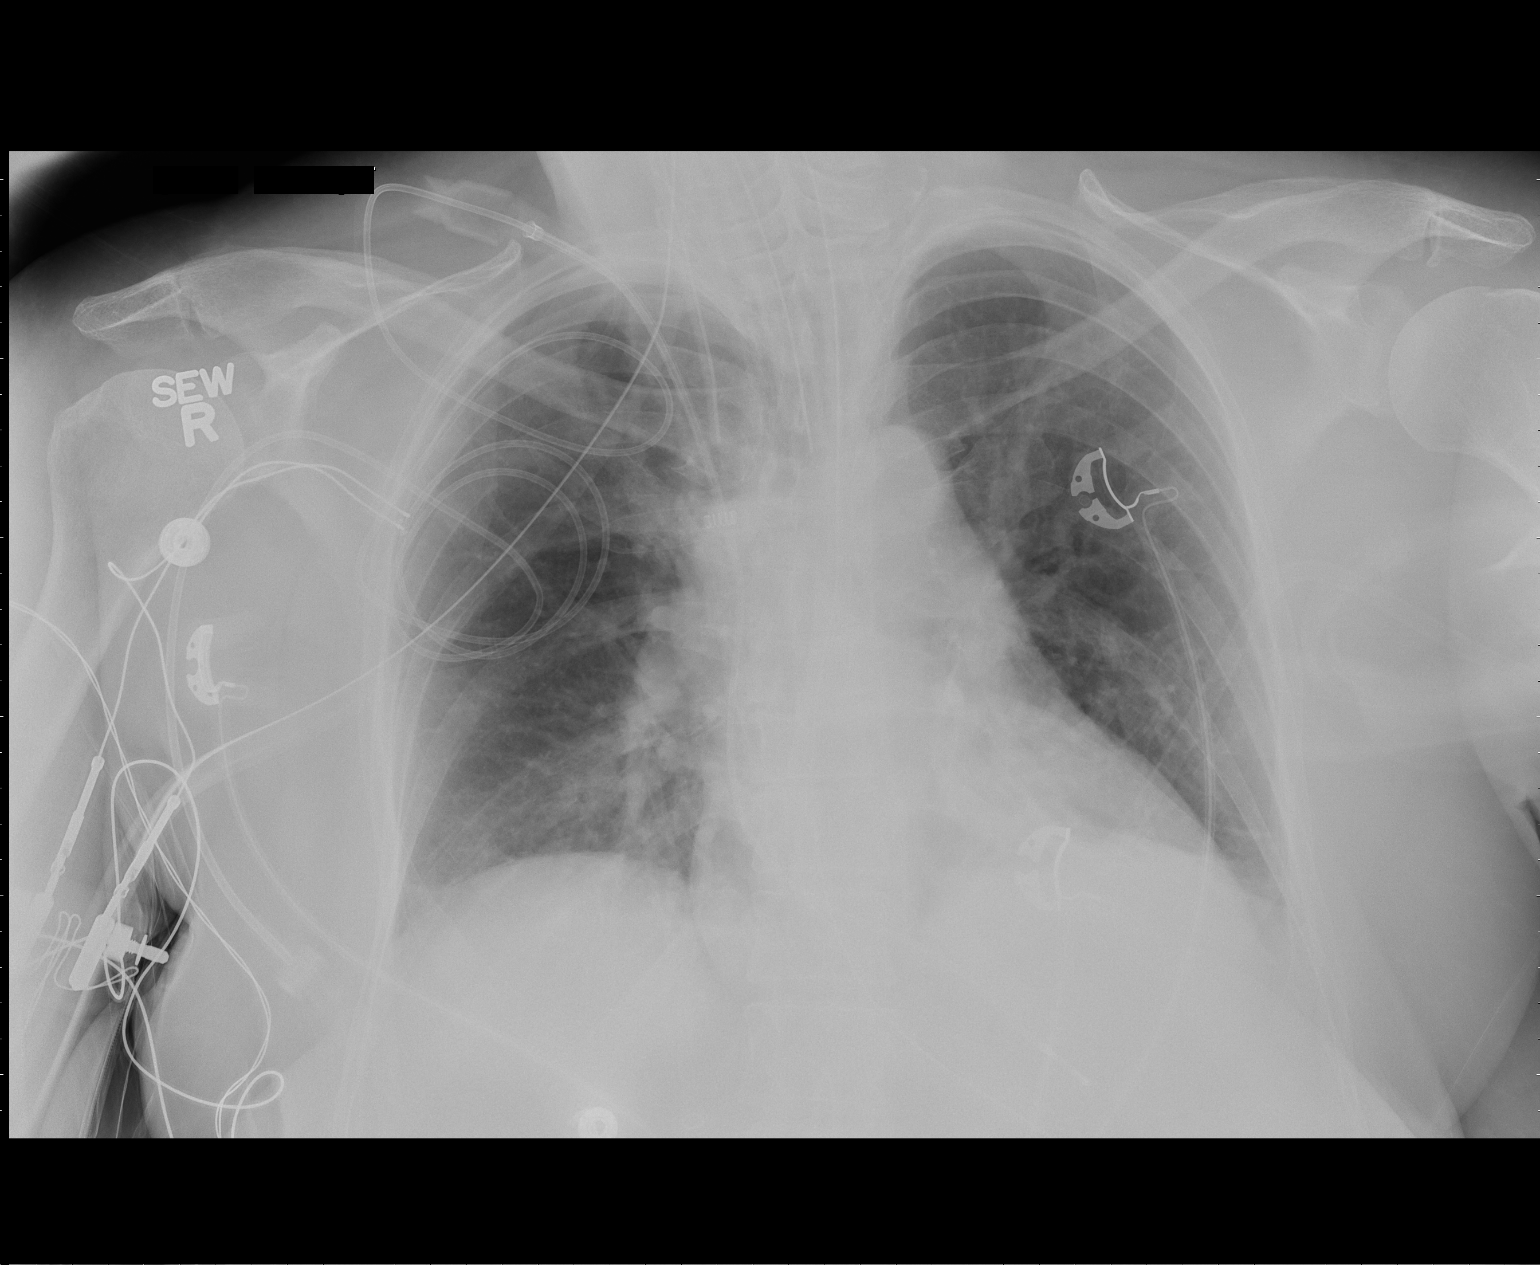

[1 of 1 positions shown; findings below may reference images not displayed]

FINDINGS: Endotracheal tube is unchanged.  Nasogastric tube extends
beyond the  inferior aspect of the film.  A single lead pacer
terminates over the right ventricle.

Midline trachea.  Normal heart size.  No pleural effusion or
pneumothorax.  No congestive failure.

Lung volumes are low.  Mild patchy bibasilar airspace disease is
similar.
IMPRESSION: Low lung volumes.  Similar bibasilar airspace disease, likely
atelectasis.

## 2011-09-17 ENCOUNTER — Other Ambulatory Visit: Payer: Self-pay | Admitting: Obstetrics and Gynecology

## 2011-09-17 DIAGNOSIS — Z1231 Encounter for screening mammogram for malignant neoplasm of breast: Secondary | ICD-10-CM

## 2011-10-01 ENCOUNTER — Ambulatory Visit
Admission: RE | Admit: 2011-10-01 | Discharge: 2011-10-01 | Disposition: A | Discharge status: Home / Self Care | Payer: 59 | Source: Ambulatory Visit | Attending: Obstetrics and Gynecology | Admitting: Obstetrics and Gynecology

## 2011-10-01 DIAGNOSIS — Z1231 Encounter for screening mammogram for malignant neoplasm of breast: Secondary | ICD-10-CM

## 2011-11-17 ENCOUNTER — Encounter (HOSPITAL_COMMUNITY): Payer: Self-pay | Admitting: Pharmacist

## 2011-11-24 ENCOUNTER — Encounter (HOSPITAL_COMMUNITY)
Admission: RE | Admit: 2011-11-24 | Discharge: 2011-11-24 | Disposition: A | Discharge status: Home / Self Care | Payer: 59 | Source: Ambulatory Visit | Attending: Obstetrics and Gynecology | Admitting: Obstetrics and Gynecology

## 2011-11-24 ENCOUNTER — Encounter (HOSPITAL_COMMUNITY): Payer: Self-pay

## 2011-11-24 HISTORY — DX: Insomnia, unspecified: G47.00

## 2011-11-24 HISTORY — DX: Cardiac murmur, unspecified: R01.1

## 2011-11-24 HISTORY — DX: Hypothyroidism, unspecified: E03.9

## 2011-11-24 HISTORY — DX: Essential (primary) hypertension: I10

## 2011-11-24 HISTORY — DX: Anxiety disorder, unspecified: F41.9

## 2011-11-24 NOTE — Patient Instructions (Addendum)
   Your procedure is scheduled on: Monday, Nov 18 at 9 am  Enter through the [employer] [facility] at: 730 am  Pick up the phone at the desk and dial [phone] and inform us of your arrival.  Please call this number if you have any problems the morning of surgery: [phone]  Remember: Do not eat food after midnight: Sunday Do not drink clear liquids after: midnight Sunday Take these medicines the morning of surgery with a SIP OF WATER:  BP and thyroid med  Do not wear jewelry, make-up, or FINGER nail polish No metal in your hair or on your body. Do not wear lotions, powders, perfumes. You may wear deodorant.  Please use your CHG wash as directed prior to surgery.  Do not shave anywhere for at least 12 hours prior to first CHG shower.  Do not bring valuables to the hospital. Contacts, dentures or bridgework may not be worn into surgery.  Patients discharged on the day of surgery will not be allowed to drive home.  Home with son Glean Salvo or daughter Florentina Addison.

## 2011-11-27 NOTE — H&P (Addendum)
Gloria Ward is an 61 y.o. female.   Chief Complaint: bleeding  HPI: 61 yo DWF G4P2 with several year h/o menopausal bleedingg, about 2-4 times per year since age 41.  For the last 1 1/2 years, she has been on HRT with E2  0.5 mg and provera 2.5 mg per day.  PUS/SHSG showed 6 m probable polyp arising from posterior wall.    Past Medical History  Diagnosis Date  . Hypothyroidism   . Hypertension   . Anxiety   . Insomnia   . Heart murmur     no prob - no med    Past Surgical History  Procedure Date  . Eye surgery     right eye  . Dental implants   C/S times two Knee arthroscopy Left 2005  No family history on file. Social History:  reports that she has never smoked. She has never used smokeless tobacco. She reports that she does not drink alcohol or use illicit drugs.Pt is an [employer] fundraiser  Allergies:  Allergies  Allergen Reactions  . Penicillins Rash    Rash and hot all over. Has tolerated Keflex    No prescriptions prior to admission    No results found for this or any previous visit (from the past 48 hour(s)). No results found.  Review of Systems  All other systems reviewed and are negative.    Height 5\' 2"  (1.575 m), weight 69.854 kg (154 lb). Physical Exam  Constitutional: She is oriented to person, place, and time. She appears well-developed and well-nourished.  HENT:  Head: Normocephalic and atraumatic.  Eyes: Conjunctivae normal are normal.  Neck: Normal range of motion. No thyromegaly present.  Cardiovascular: Normal rate and regular rhythm.   Respiratory: Effort normal and breath sounds normal.  GI: Soft. Bowel sounds are normal. She exhibits no distension.  Genitourinary: Vagina normal and uterus normal.  Musculoskeletal: Normal range of motion.  Neurological: She is alert and oriented to person, place, and time.  Skin: Skin is warm and dry.       Diffuse depigmentation consistent with tinea versicolor  Psychiatric: She has a normal mood and affect.      Assessment/Plan Menopausal bleeding.  Plan Hysteroscopy, resection of polyp, D & C  Ilda Laskin P 11/27/2011, 5:04 PM

## 2011-11-30 ENCOUNTER — Encounter (HOSPITAL_COMMUNITY): Payer: Self-pay | Admitting: Anesthesiology

## 2011-11-30 ENCOUNTER — Ambulatory Visit (HOSPITAL_COMMUNITY)
Admission: RE | Admit: 2011-11-30 | Discharge: 2011-11-30 | Disposition: A | Discharge status: Home / Self Care | Payer: 59 | Source: Ambulatory Visit | Attending: Obstetrics and Gynecology | Admitting: Obstetrics and Gynecology

## 2011-11-30 ENCOUNTER — Ambulatory Visit (HOSPITAL_COMMUNITY): Payer: 59 | Admitting: Anesthesiology

## 2011-11-30 ENCOUNTER — Encounter (HOSPITAL_COMMUNITY): Payer: Self-pay | Admitting: *Deleted

## 2011-11-30 ENCOUNTER — Encounter (HOSPITAL_COMMUNITY)
Admission: RE | Disposition: A | Discharge status: Home / Self Care | Payer: Self-pay | Source: Ambulatory Visit | Attending: Obstetrics and Gynecology

## 2011-11-30 DIAGNOSIS — N950 Postmenopausal bleeding: Secondary | ICD-10-CM | POA: Insufficient documentation

## 2011-11-30 DIAGNOSIS — N840 Polyp of corpus uteri: Secondary | ICD-10-CM | POA: Insufficient documentation

## 2011-11-30 HISTORY — PX: DILATATION & CURRETTAGE/HYSTEROSCOPY WITH RESECTOCOPE: SHX5572

## 2011-11-30 LAB — CBC
MCV: 89.3 fL (ref 78.0–100.0)
Platelets: 152 10*3/uL (ref 150–400)
RDW: 13.1 % (ref 11.5–15.5)
WBC: 7.2 10*3/uL (ref 4.0–10.5)

## 2011-11-30 LAB — SURGICAL PCR SCREEN: Staphylococcus aureus: POSITIVE — AB

## 2011-11-30 SURGERY — DILATATION & CURETTAGE/HYSTEROSCOPY WITH RESECTOCOPE
Anesthesia: General | Site: Uterus | Wound class: Contaminated

## 2011-11-30 MED ORDER — FENTANYL CITRATE 0.05 MG/ML IJ SOLN
INTRAMUSCULAR | Status: AC
  Filled 2011-11-30: qty 5

## 2011-11-30 MED ORDER — PROPOFOL 10 MG/ML IV EMUL
INTRAVENOUS | Status: AC
  Filled 2011-11-30: qty 20

## 2011-11-30 MED ORDER — LACTATED RINGERS IV SOLN
INTRAVENOUS | Status: DC
  Administered 2011-11-30 (×2): via INTRAVENOUS

## 2011-11-30 MED ORDER — LIDOCAINE HCL (CARDIAC) 20 MG/ML IV SOLN
INTRAVENOUS | Status: DC | PRN
  Administered 2011-11-30: 30 mg via INTRAVENOUS

## 2011-11-30 MED ORDER — MIDAZOLAM HCL 2 MG/2ML IJ SOLN
INTRAMUSCULAR | Status: AC
  Filled 2011-11-30: qty 2

## 2011-11-30 MED ORDER — KETOROLAC TROMETHAMINE 30 MG/ML IJ SOLN
INTRAMUSCULAR | Status: DC | PRN
  Administered 2011-11-30: 30 mg via INTRAVENOUS

## 2011-11-30 MED ORDER — FENTANYL CITRATE 0.05 MG/ML IJ SOLN
INTRAMUSCULAR | Status: DC | PRN
  Administered 2011-11-30 (×2): 100 ug via INTRAVENOUS

## 2011-11-30 MED ORDER — ONDANSETRON HCL 4 MG/2ML IJ SOLN
INTRAMUSCULAR | Status: DC | PRN
  Administered 2011-11-30: 4 mg via INTRAVENOUS

## 2011-11-30 MED ORDER — ONDANSETRON HCL 4 MG/2ML IJ SOLN
INTRAMUSCULAR | Status: AC
  Filled 2011-11-30: qty 2

## 2011-11-30 MED ORDER — PROPOFOL 10 MG/ML IV EMUL
INTRAVENOUS | Status: DC | PRN
  Administered 2011-11-30 (×5): 50 mg via INTRAVENOUS

## 2011-11-30 MED ORDER — GLYCINE 1.5 % IR SOLN
Status: DC | PRN
  Administered 2011-11-30: 3000 mL

## 2011-11-30 MED ORDER — MIDAZOLAM HCL 5 MG/5ML IJ SOLN
INTRAMUSCULAR | Status: DC | PRN
  Administered 2011-11-30: 2 mg via INTRAVENOUS

## 2011-11-30 MED ORDER — LIDOCAINE HCL (CARDIAC) 20 MG/ML IV SOLN
INTRAVENOUS | Status: AC
  Filled 2011-11-30: qty 5

## 2011-11-30 MED ORDER — FENTANYL CITRATE 0.05 MG/ML IJ SOLN
INTRAMUSCULAR | Status: AC
  Administered 2011-11-30: 50 ug via INTRAVENOUS
  Filled 2011-11-30: qty 2

## 2011-11-30 MED ORDER — MUPIROCIN 2 % EX OINT
TOPICAL_OINTMENT | CUTANEOUS | Status: AC
  Filled 2011-11-30: qty 22

## 2011-11-30 MED ORDER — LIDOCAINE HCL 1 % IJ SOLN
INTRAMUSCULAR | Status: DC | PRN
  Administered 2011-11-30: 20 mL

## 2011-11-30 MED ORDER — METOCLOPRAMIDE HCL 5 MG/ML IJ SOLN: 10.0000 mg | Freq: Once | INTRAMUSCULAR | Status: DC | PRN

## 2011-11-30 MED ORDER — MEPERIDINE HCL 25 MG/ML IJ SOLN: 6.2500 mg | INTRAMUSCULAR | Status: DC | PRN

## 2011-11-30 MED ORDER — FENTANYL CITRATE 0.05 MG/ML IJ SOLN
25.0000 ug | INTRAMUSCULAR | Status: DC | PRN
  Administered 2011-11-30 (×2): 50 ug via INTRAVENOUS

## 2011-11-30 SURGICAL SUPPLY — 18 items
CANISTER SUCTION 2500CC (MISCELLANEOUS) ×2 IMPLANT
CATH ROBINSON RED A/P 16FR (CATHETERS) ×2 IMPLANT
CONTAINER PREFILL 10% NBF 60ML (FORM) ×3 IMPLANT
CORD ACTIVE DISPOSABLE (ELECTRODE) ×1
CORD ELECTRO ACTIVE DISP (ELECTRODE) ×1 IMPLANT
DRESSING TELFA 8X3 (GAUZE/BANDAGES/DRESSINGS) ×2 IMPLANT
ELECT LOOP GYNE PRO 24FR (CUTTING LOOP) ×2
ELECT REM PT RETURN 9FT ADLT (ELECTROSURGICAL) ×2
ELECTRODE LOOP GYNE PRO 24FR (CUTTING LOOP) IMPLANT
ELECTRODE REM PT RTRN 9FT ADLT (ELECTROSURGICAL) ×1 IMPLANT
GLOVE BIOGEL PI IND STRL 7.0 (GLOVE) ×2 IMPLANT
GLOVE BIOGEL PI INDICATOR 7.0 (GLOVE) ×2
GLOVE ECLIPSE 6.5 STRL STRAW (GLOVE) ×2 IMPLANT
GOWN STRL REIN XL XLG (GOWN DISPOSABLE) ×6 IMPLANT
PACK HYSTEROSCOPY LF (CUSTOM PROCEDURE TRAY) ×2 IMPLANT
PAD OB MATERNITY 4.3X12.25 (PERSONAL CARE ITEMS) ×2 IMPLANT
TOWEL OR 17X24 6PK STRL BLUE (TOWEL DISPOSABLE) ×4 IMPLANT
WATER STERILE IRR 1000ML POUR (IV SOLUTION) ×2 IMPLANT

## 2011-11-30 NOTE — Anesthesia Postprocedure Evaluation (Signed)
  Anesthesia Post-op Note  Patient: Gloria Ward  Procedure(s) Performed: Procedure(s) (LRB) with comments: DILATATION & CURETTAGE/HYSTEROSCOPY WITH RESECTOCOPE (N/A)  Patient Location: PACU  Anesthesia Type:General  Level of Consciousness: awake, alert  and oriented  Airway and Oxygen Therapy: Patient Spontanous Breathing  Post-op Pain: none  Post-op Assessment: Post-op Vital signs reviewed, Patient's Cardiovascular Status Stable, Respiratory Function Stable, Patent Airway, No signs of Nausea or vomiting and Pain level controlled  Post-op Vital Signs: Reviewed and stable  Complications: No apparent anesthesia complications

## 2011-11-30 NOTE — Discharge Instructions (Signed)
Hysteroscopy Hysteroscopy is a procedure used for looking inside the womb (uterus). It may be done for many different reasons, including:  To evaluate abnormal bleeding, fibroid (benign, noncancerous) tumors, polyps, scar tissue (adhesions), and possibly cancer of the uterus.  To look for lumps (tumors) and other uterine growths.  To look for causes of why a woman cannot get pregnant (infertility), causes of recurrent loss of pregnancy (miscarriages), or a lost intrauterine device (IUD).  To perform a sterilization by blocking the fallopian tubes from inside the uterus. A hysteroscopy should be done right after a menstrual period to be sure you are not pregnant. LET YOUR CAREGIVER KNOW ABOUT:   Allergies.  Medicines taken, including herbs, eyedrops, over-the-counter medicines, and creams.  Use of steroids (by mouth or creams).  Previous problems with anesthetics or numbing medicines.  History of bleeding or blood problems.  History of blood clots.  Possibility of pregnancy, if this applies.  Previous surgery.  Other health problems. RISKS AND COMPLICATIONS   Putting a hole in the uterus.  Excessive bleeding.  Infection.  Damage to the cervix.  Injury to other organs.  Allergic reaction to medicines.  Too much fluid used in the uterus for the procedure. BEFORE THE PROCEDURE   Do not take aspirin or blood thinners for a week before the procedure, or as directed. It can cause bleeding.  Arrive at least 60 minutes before the procedure or as directed to read and sign the necessary forms.  Arrange for someone to take you home after the procedure.  If you smoke, do not smoke for 2 weeks before the procedure. PROCEDURE   Your caregiver may give you medicine to relax you. He or she may also give you a medicine that numbs the area around the cervix (local anesthetic) or a medicine that makes you sleep (general anesthesia).  Sometimes, a medicine is placed in the cervix  the day before the procedure. This medicine makes the cervix have a larger opening (dilate). This makes it easier for the instrument to be inserted into the uterus.  A small instrument (hysteroscope) is inserted through the vagina into the uterus. This instrument is similar to a pencil-sized telescope with a light.  During the procedure, air or a liquid is put into the uterus, which allows the surgeon to see better.  Sometimes, tissue is gently scraped from inside the uterus. These tissue samples are sent to a specialist who looks at tissue samples (pathologist). The pathologist will give a report to your caregiver. This will help your caregiver decide if further treatment is necessary. The report will also help your caregiver decide on the best treatment if the test comes back abnormal. AFTER THE PROCEDURE   If you had a general anesthetic, you may be groggy for a couple hours after the procedure.  If you had a local anesthetic, you will be advised to rest at the surgical center or caregiver's office until you are stable and feel ready to go home.  You may have some cramping for a couple days.  You may have bleeding, which varies from light spotting for a few days to menstrual-like bleeding for up to 3 to 7 days. This is normal.  Have someone take you home. FINDING OUT THE RESULTS OF YOUR TEST Not all test results are available during your visit. If your test results are not back during the visit, make an appointment with your caregiver to find out the results. Do not assume everything is normal if you  have not heard from your caregiver or the medical facility. It is important for you to follow up on all of your test results. HOME CARE INSTRUCTIONS   Do not drive for 24 hours or as instructed.  Only take over-the-counter or prescription medicines for pain, discomfort, or fever as directed by your caregiver.  Do not take aspirin. It can cause or aggravate bleeding.  Do not drive or drink  alcohol while taking pain medicine.  You may resume your usual diet.  Do not use tampons, douche, or have sexual intercourse for 2 weeks, or as advised by your caregiver.  Rest and sleep for the first 24 to 48 hours.  Take your temperature twice a day for 4 to 5 days. Write it down. Give these temperatures to your caregiver if they are abnormal (above 98.6 F or 37.0 C).  Take medicines your caregiver has ordered as directed.  Follow your caregiver's advice regarding diet, exercise, lifting, driving, and general activities.  Take showers instead of baths for 2 weeks, or as recommended by your caregiver.  If you develop constipation:  Take a mild laxative with the advice of your caregiver.  Eat bran foods.  Drink enough water and fluids to keep your urine clear or pale yellow.  Try to have someone with you or available to you for the first 24 to 48 hours, especially if you had a general anesthetic.  Make sure you and your family understand everything about your operation and recovery.  Follow your caregiver's advice regarding follow-up appointments and Pap smears. SEEK MEDICAL CARE IF:   You feel dizzy or lightheaded.  You feel sick to your stomach (nauseous).  You develop abnormal vaginal discharge.  You develop a rash.  You have an abnormal reaction or allergy to your medicine.  You need stronger pain medicine. SEEK IMMEDIATE MEDICAL CARE IF:   Bleeding is heavier than a normal menstrual period or you have blood clots.  You have an oral temperature above 102 F (38.9 C), not controlled by medicine.  You have increasing cramps or pains not relieved with medicine.  You develop belly (abdominal) pain that does not seem to be related to the same area of earlier cramping and pain.  You pass out.  You develop pain in the tops of your shoulders (shoulder strap areas).  You develop shortness of breath. MAKE SURE YOU:   Understand these instructions.  Will watch  your condition.  Will get help right away if you are not doing well or get worse. Document Released: 04/06/2000 Document Revised: 03/23/2011 Document Reviewed: 07/30/2008 [facility] Patient Information 2013 [place], [place].     Dilation and Curettage or Vacuum Curettage Dilation and curettage (D&C) and vacuum curettage are minor procedures. A D&C involves stretching (dilation) the cervix and scraping (curettage) the inside lining of the womb (uterus). During a D&C, tissue is gently scraped from the inside lining of the uterus. During a vacuum curettage, the lining and tissue in the uterus are removed with the use of gentle suction. Curettage may be performed for diagnostic or therapeutic purposes. As a diagnostic procedure, curettage is performed for the purpose of examining tissues from the uterus. Tissue examination may help determine causes or treatment options for symptoms. A diagnostic curettage may be performed for the following symptoms:  Irregular bleeding in the uterus.  Bleeding with the development of clots.  Spotting between menstrual periods.  Prolonged menstrual periods.  Bleeding after menopause.  No menstrual period (amenorrhea).  A change in  size and shape of the uterus. A therapeutic curettage is performed to remove tissue, blood, or a contraceptive device. Therapeutic curettage may be performed for the following conditions:   Removal of an IUD (intrauterine device).  Removal of retained placenta after giving birth. Retained placenta can cause bleeding severe enough to require transfusions or an infection.  Abortion.  Miscarriage.  Removal of polyps inside the uterus.  Removal of uncommon types of fibroids (noncancerous lumps). LET YOUR CAREGIVER KNOW ABOUT:   Allergies to food or medicine.  Medicines taken, including vitamins, herbs, eyedrops, over-the-counter medicines, and creams.  Use of steroids (by mouth or creams).  Previous problems with  anesthetics or numbing medicines.  History of bleeding problems or blood clots.  Previous surgery.  Other health problems, including diabetes and kidney problems.  Possibility of pregnancy, if this applies. RISKS AND COMPLICATIONS   Excessive bleeding.  Infection of the uterus.  Damage to the cervix.  Development of scar tissue (adhesions) inside the uterus, later causing abnormal amounts of menstrual bleeding.  Complications from the general anesthetic, if a general anesthetic is used.  Putting a hole (perforation) in the uterus. This is rare. BEFORE THE PROCEDURE   Eat and drink before the procedure only as directed by your caregiver.  Arrange for someone to take you home. PROCEDURE   This procedure may be done in a hospital, outpatient clinic, or caregiver's office.  You may be given a general anesthetic or a local anesthetic in and around the cervix.  You will lie on your back with your legs in stirrups.  There are two ways in which your cervix can be softened and dilated. These include:  Taking a medicine.  Having thin rods (laminaria) inserted into your cervix.  A curved tool (curette) will scrape cells from the inside lining of the uterus and will then be removed. This procedure usually takes about 15 to 30 minutes. AFTER THE PROCEDURE   You will rest in the recovery area until you are stable and are ready to go home.  You will need to have someone take you home.  You may feel sick to your stomach (nauseous) or throw up (vomit) if you had general anesthesia.  You may have a sore throat if a tube was placed in your throat during general anesthesia.  You may have light cramping and bleeding for 2 days to 2 weeks after the procedure.  Your uterus needs to make a new lining after the procedure. This may make your next period late. Document Released: 12/29/2004 Document Revised: 03/23/2011 Document Reviewed: 07/27/2008 [facility] [place] [facility] Patient Information 2013  [place], [place].

## 2011-11-30 NOTE — Anesthesia Preprocedure Evaluation (Addendum)
Anesthesia Evaluation  Patient identified by MRN, date of birth, ID band Patient awake    Reviewed: Allergy & Precautions, H&P , NPO status , Patient's Chart, lab work & pertinent test results  Airway Mallampati: II TM Distance: >3 FB Neck ROM: Full    Dental  (+) Teeth Intact, Upper Dentures and Lower Dentures   Pulmonary neg pulmonary ROS,  breath sounds clear to auscultation  Pulmonary exam normal       Cardiovascular hypertension, Pt. on medications + Valvular Problems/Murmurs Rhythm:Regular Rate:Normal     Neuro/Psych Anxiety negative neurological ROS     GI/Hepatic   Endo/Other  Hypothyroidism   Renal/GU   negative genitourinary   Musculoskeletal negative musculoskeletal ROS (+)   Abdominal   Peds  Hematology negative hematology ROS (+)   Anesthesia Other Findings   Reproductive/Obstetrics PMB Endometrial polyp                          Anesthesia Physical Anesthesia Plan  ASA: II  Anesthesia Plan: MAC   Post-op Pain Management:    Induction:   Airway Management Planned: Natural Airway  Additional Equipment:   Intra-op Plan:   Post-operative Plan: Extubation in OR  Informed Consent: I have reviewed the patients History and Physical, chart, labs and discussed the procedure including the risks, benefits and alternatives for the proposed anesthesia with the patient or authorized representative who has indicated his/her understanding and acceptance.   Dental advisory given  Plan Discussed with: CRNA, Anesthesiologist and Surgeon  Anesthesia Plan Comments: (Patient requests sedation with paracervical block.)       Anesthesia Quick Evaluation

## 2011-11-30 NOTE — Transfer of Care (Signed)
Immediate Anesthesia Transfer of Care Note  Patient: Gloria Ward  Procedure(s) Performed: Procedure(s) (LRB) with comments: DILATATION & CURETTAGE/HYSTEROSCOPY WITH RESECTOCOPE (N/A)  Patient Location: PACU  Anesthesia Type:MAC  Level of Consciousness: awake, alert  and oriented  Airway & Oxygen Therapy: Patient Spontanous Breathing  Post-op Assessment: Report given to PACU RN and Post -op Vital signs reviewed and stable  Post vital signs: stable  Complications: No apparent anesthesia complications

## 2011-11-30 NOTE — Interval H&P Note (Signed)
History and Physical Interval Note:  11/30/2011 8:47 AM  Gloria Ward  has presented today for surgery, with the diagnosis of PMB  The various methods of treatment have been discussed with the patient and family. After consideration of risks, benefits and other options for treatment, the patient has consented to  Procedure(s) (LRB) with comments: DILATATION & CURETTAGE/HYSTEROSCOPY WITH RESECTOCOPE (N/A) as a surgical intervention .  The patient's history has been reviewed, patient examined, no change in status, stable for surgery.  I have reviewed the patient's chart and labs.  Questions were answered to the patient's satisfaction.     Saida Lonon P

## 2011-11-30 NOTE — Op Note (Signed)
Preoperative diagnosis: Postmenopausal bleeding, small endometrial polyp Postoperative diagnosis: Same, path pending  Procedure: Hysteroscopy with resection of endometrial polyp, D&C Surgeon: Dr. Aram Beecham Jaxxson Cavanah  anesthesia: Local with IV sedation Glycine deficit: 70 cc Complications: None Procedure: The patient was taken to the operating room and after induction of adequate IV sedation per anesthesia was placed in the dorsolithotomy position and prepped and draped in usual fashion. the bladder was drained with a red rubber catheter.  A posterior weighted and anterior sims retractor were placed and the cervix was grasped on its anterior lip with a single-tooth tenaculum.  Paracervical block was instituted by injecting 10 cc of 1% Xylocaine at each of 3 and 9:00.  The uterus then sounded to 7 m.  The cervix was dilated to #31 Shawnie Pons.  The operative hysteroscope was introduced using glycine as a distention medium with the pressure pump set at 80 mm of mercury.  Hysteroscopy revealed the endometrium to look quite atrophic except there was a small polyp emanating from the posterior wall on the patient's right.  Single loop cautery was used to resect the polyp.  The scope was then withdrawn.  Gentle sharp curettage was then done, and the specimen was sent to pathology.  Instruments were withdrawn from the vagina and the procedure was terminated.  Sponge needle and instrument counts were correct.  The patient was taken to the recovery room in satisfactory condition.

## 2011-12-01 ENCOUNTER — Encounter (HOSPITAL_COMMUNITY): Payer: Self-pay | Admitting: Obstetrics and Gynecology

## 2011-12-22 ENCOUNTER — Other Ambulatory Visit (HOSPITAL_COMMUNITY): Payer: Self-pay | Admitting: Family Medicine

## 2011-12-22 ENCOUNTER — Ambulatory Visit (HOSPITAL_COMMUNITY)
Admission: RE | Admit: 2011-12-22 | Discharge: 2011-12-22 | Disposition: A | Discharge status: Home / Self Care | Payer: 59 | Source: Ambulatory Visit | Attending: Family Medicine | Admitting: Family Medicine

## 2011-12-22 DIAGNOSIS — R609 Edema, unspecified: Secondary | ICD-10-CM

## 2011-12-22 DIAGNOSIS — M79609 Pain in unspecified limb: Secondary | ICD-10-CM

## 2011-12-22 DIAGNOSIS — R52 Pain, unspecified: Secondary | ICD-10-CM | POA: Insufficient documentation

## 2011-12-22 NOTE — Progress Notes (Signed)
Left:  No evidence of DVT, superficial thrombosis, or Baker's cyst.  Right:  Negative for DVT in the common femoral vein.  

## 2018-03-15 ENCOUNTER — Ambulatory Visit (INDEPENDENT_AMBULATORY_CARE_PROVIDER_SITE_OTHER): Payer: 59 | Admitting: Psychology

## 2018-03-15 DIAGNOSIS — F429 Obsessive-compulsive disorder, unspecified: Secondary | ICD-10-CM | POA: Diagnosis not present

## 2019-06-27 ENCOUNTER — Other Ambulatory Visit: Payer: Self-pay

## 2019-12-12 ENCOUNTER — Institutional Professional Consult (permissible substitution): Payer: Self-pay | Admitting: Plastic Surgery

## 2020-07-08 ENCOUNTER — Other Ambulatory Visit: Payer: Self-pay

## 2020-07-08 ENCOUNTER — Telehealth: Payer: Self-pay

## 2020-07-08 ENCOUNTER — Ambulatory Visit (INDEPENDENT_AMBULATORY_CARE_PROVIDER_SITE_OTHER): Payer: No Typology Code available for payment source | Admitting: Obstetrics and Gynecology

## 2020-07-08 ENCOUNTER — Encounter: Payer: Self-pay | Admitting: Obstetrics and Gynecology

## 2020-07-08 VITALS — BP 158/84 | HR 92

## 2020-07-08 DIAGNOSIS — Z79899 Other long term (current) drug therapy: Secondary | ICD-10-CM

## 2020-07-08 DIAGNOSIS — N950 Postmenopausal bleeding: Secondary | ICD-10-CM

## 2020-07-08 DIAGNOSIS — N898 Other specified noninflammatory disorders of vagina: Secondary | ICD-10-CM | POA: Diagnosis not present

## 2020-07-08 DIAGNOSIS — Z79818 Long term (current) use of other agents affecting estrogen receptors and estrogen levels: Secondary | ICD-10-CM

## 2020-07-08 NOTE — Addendum Note (Signed)
Addended by: Yisroel Ramming, Patricia Fargo E on: 07/08/2020 11:01 AM   Modules accepted: Orders

## 2020-07-08 NOTE — Progress Notes (Signed)
GYNECOLOGY  VISIT   HPI: 70 y.o.   Single  Caucasian  female   [id number] with Patient's last menstrual period was 06/30/2011.   here for postmenopausal bleeding starting 3 weeks ago.   She is doing a vaginal implant of iodine on a tampon for 1 week.  States the blood has resolved as of today.  No pain.  Patient is using Estradiol compounded 6 mg orally due to hair loss.  Compounded Rx through Dr. Harrington Challenger. She stopped her progesterone which she was taking orally.  She is not interested in taking progesterone but she is ware of the recommendations to do this to reduce the risk of uterine cancer.   Had a hysteroscopy with dilation and curettage with Dr. Marvia Pickles.  States she has zero interest in having another dilation and curettage.   No change in partner.  Has had STD screening done.   Does like to follow alternative health.  Does a coffee enema every day.   Is a nurse.   GYNECOLOGIC HISTORY: Patient's last menstrual period was 06/30/2011. Contraception:  PMB Menopausal hormone therapy:  Estradiol Last mammogram:  10-01-11 Neg/Birads1.  She declines mammogram. Last pap smear: 2 months ago with Dr.C.Alan Ross--normal per patient OB History     Gravida  5   Para  2   Term  2   Preterm      AB  2   Living         SAB  1   IAB      Ectopic      Multiple      Live Births                 There are no problems to display for this patient.   Past Medical History:  Diagnosis Date   Anxiety    Heart murmur    no prob - no med   Hypertension    Hypothyroidism    Insomnia     Past Surgical History:  Procedure Laterality Date   AUGMENTATION MAMMAPLASTY     CESAREAN SECTION  1986, 1989   COSMETIC SURGERY     face lift   dental implants     DILATATION & CURRETTAGE/HYSTEROSCOPY WITH RESECTOCOPE  11/30/2011   Procedure: DILATATION & CURETTAGE/HYSTEROSCOPY WITH RESECTOCOPE;  Surgeon: Peri Maris, MD;  Location: [facility];  Service: Gynecology;   Laterality: N/A;   EYE SURGERY     right eye    Current Outpatient Medications  Medication Sig Dispense Refill   dextroamphetamine (DEXTROSTAT) 10 MG tablet 2.5 tablets in the morning     EPINEPHrine 0.3 mg/0.3 mL IJ SOAJ injection See admin instructions.     fosinopril (MONOPRIL) 40 MG tablet Take 40 mg by mouth daily.     liothyronine (CYTOMEL) 25 MCG tablet 1 TABLET ON AN EMPTY STOMACH ORALLY TWICE A DAY 90 DAYS-NON PHARMACY     LORazepam (ATIVAN) 2 MG tablet 2 tablets at bedtime as needed     Melatonin 10 MG TABS 1 tablet at bedtime as needed with food     metoprolol succinate (TOPROL-XL) 100 MG 24 hr tablet 1 tablet     PRESCRIPTION MEDICATION Estradiol 6 mg 1 tablet daily     No current facility-administered medications for this visit.     ALLERGIES: Tramadol hcl, Trazodone hcl, and Penicillins  History reviewed. No pertinent family history.  Social History   Socioeconomic History   Marital status: Single    Spouse name: Not on file  Number of children: Not on file   Years of education: Not on file   Highest education level: Not on file  Occupational History   Not on file  Tobacco Use   Smoking status: Never   Smokeless tobacco: Never  Vaping Use   Vaping Use: Never used  Substance and Sexual Activity   Alcohol use: Yes    Comment: socially   Drug use: No   Sexual activity: Yes    Birth control/protection: Post-menopausal  Other Topics Concern   Not on file  Social History Narrative   Not on file   Social Determinants of Health   Financial Resource Strain: Not on file  Food Insecurity: Not on file  Transportation Needs: Not on file  Physical Activity: Not on file  Stress: Not on file  Social Connections: Not on file  Intimate Partner Violence: Not on file    Review of Systems  Genitourinary:  Positive for vaginal bleeding (postmenopausal bleeding).  All other systems reviewed and are negative.  PHYSICAL EXAMINATION:    BP (!) 158/84   Pulse  92   LMP 06/30/2011   SpO2 99%     General appearance: alert, cooperative and appears stated age Head: Normocephalic, without obvious abnormality, atraumatic Neck: no adenopathy, supple, symmetrical, trachea midline and thyroid normal to inspection and palpation Lungs: clear to auscultation bilaterally Breasts: normal appearance, no masses or tenderness, No nipple retraction or dimpling, No nipple discharge or bleeding, No axillary or supraclavicular adenopathy Heart: regular rate and rhythm Abdomen: soft, non-tender, no masses,  no organomegaly Extremities: extremities normal, atraumatic, no cyanosis or edema Skin: Skin color, texture, turgor normal. No rashes or lesions Lymph nodes: Cervical, supraclavicular, and axillary nodes normal. No abnormal inguinal nodes palpated Neurologic: Grossly normal  Pelvic: External genitalia:  no lesions              Urethra:  normal appearing urethra with no masses, tenderness or lesions              Bartholins and Skenes: normal                 Vagina: yellowish drainage.  Thickened vaginal mucosa of the right vaginal apex and an ulcerated area.               Cervix: no lesions                Bimanual Exam:  Uterus:  normal size, contour, position, consistency, mobility, non-tender              Adnexa: no mass, fullness, tenderness              Rectal exam: yes.  Confirms.              Anus:  normal sphincter tone, no lesions  Chaperone was present for exam.  ASSESSMENT  Postmenopausal bleeding.  Hx benign endometrial polyp removed hysteroscopically 2013.  On unopposed estradiol. Vaginal lesion.  Doing iodine treatments in vagina.  PLAN  We discussed potential reasons for bleeding - vaginal lesions, polyps, hyperplasia, and cancer.  I do recommend estrogen be used in conjunction with progesterone for HRT.  Stop vaginal iodine treatments as they may be contributing or are the cause of the vaginal lesion.  Will get a copy of her last visit  with Dr. Harrington Challenger and get a copy of her pap.  Return for pelvic ultrasound and endometrial biopsy . Will do colposcopy of the vagina and biopsy at a separate  visit.

## 2020-07-08 NOTE — Telephone Encounter (Signed)
Message from [place] at front desk. "Patient is scheduled for colpo before the ultrasound and endo biopsy is this okay?"

## 2020-07-08 NOTE — Patient Instructions (Signed)
Postmenopausal Bleeding Postmenopausal bleeding is any bleeding that a woman has after she has entered menopause. Menopause is the end of a woman's fertile years. After menopause, a woman no longer ovulates and does not have menstrual periods. Therefore, she should no longer have bleeding from her vagina. Postmenopausal bleeding may have various causes, including: Menopausal hormone therapy (MHT). Endometrial atrophy. After menopause, low estrogen hormone levels cause the membrane that lines the uterus (endometrium) to become thin. You may have bleeding as the endometrium thins. Endometrial hyperplasia. This condition is caused by excess estrogen hormones and low levels of progesterone hormones. The excess estrogen causes the endometrium to thicken, which can lead to bleeding. In some cases, this can lead to cancer of the uterus. Endometrial cancer. Noncancerous growths (polyps) on the endometrium, the lining of the uterus, or the cervix. Uterine fibroids. These are noncancerous growths in or around the uterus muscle tissue that can cause heavy bleeding. Any type of postmenopausal bleeding, even if it appears to be a typical menstrual period, should be checked by your health care provider. Treatment will depend on the cause of the bleeding. Follow these instructions at home:  Pay attention to any changes in your symptoms. Let your health care provider know about them. Avoid using tampons and douches as told by your health care provider. Change your pads regularly. Get regular pelvic exams, including Pap tests, as told by your health care provider. Take iron supplements as told by your health care provider. Take over-the-counter and prescription medicines only as told by your health care provider. Keep all follow-up visits. This is important. Contact a health care provider if: You have new bleeding from the vagina after menopause. You have pain in your abdomen. Get help right away if: You have  a fever or chills. You have severe pain with bleeding. You are passing blood clots. You have heavy bleeding, need more than 1 pad an hour, and have never experienced this before. You have headaches or feel faint or dizzy. Summary Postmenopausal bleeding is any bleeding that a woman has after she has entered into menopause. Postmenopausal bleeding may have various causes. Treatment will depend on the cause of the bleeding. Any type of postmenopausal bleeding, even if it appears to be a typical menstrual period, should be checked by your health care provider. Be sure to pay attention to any changes in your symptoms and keep all follow-up visits. This information is not intended to replace advice given to you by your health care provider. Make sure you discuss any questions you have with your health care provider. Document Revised: 06/15/2019 Document Reviewed: 06/15/2019 Elsevier Patient Education  2022 Elsevier Inc.  

## 2020-07-09 NOTE — Telephone Encounter (Signed)
Left message to call me.

## 2020-07-09 NOTE — Telephone Encounter (Signed)
Please try to schedule her pelvic ultrasound at [facility] to see if we can get the ultrasound done sooner.   I may be able to do the colposcopy and the endometrial biopsy on the same day.   Thank you for checking in.

## 2020-07-28 ENCOUNTER — Other Ambulatory Visit: Payer: Self-pay | Admitting: Obstetrics and Gynecology

## 2020-08-06 ENCOUNTER — Ambulatory Visit: Payer: Self-pay | Admitting: Nurse Practitioner

## 2020-08-06 NOTE — Telephone Encounter (Addendum)
Left message to call me. I explained that Dr. Quincy Simmonds had recommended back when she scheduled her appts that we see if we can get the u/s scheduled earlier. I asked her to call me and let me know if okay if I try to schedule her at [place] sooner.

## 2020-08-07 NOTE — Progress Notes (Deleted)
  Subjective:     Patient ID: Gloria Ward, female   DOB: [birth year], 70 y.o.   MRN: [id number]  HPI Patient here today for colposcopy for vaginal lesion.   Review of Systems LMP:PMP Contraception:PMP     Objective:   Physical Exam     Assessment:     ***    Plan:     ***

## 2020-08-16 ENCOUNTER — Ambulatory Visit: Payer: No Typology Code available for payment source | Admitting: Obstetrics and Gynecology

## 2020-08-22 ENCOUNTER — Telehealth: Payer: Self-pay

## 2020-08-22 NOTE — Telephone Encounter (Signed)
Spoke with patient regarding benefits for scheduled Ultrasound. Patient acknowledges understanding of information presented.  Spoke with patient regarding benefits for recommended Colposcopy. Patient stated that she believes a polyp will be found on ultrasound and does not think a colposcopy is necessary. Patient declines to schedule.  Routing to Dr. Quincy Simmonds as Juluis Rainier.

## 2020-08-26 NOTE — Telephone Encounter (Signed)
The patient has a vaginal lesion and needs assessment of this lesion and biopsy to rule out disease including cancer.  I do not mean to create fear for her, but the vaginal area did not look normal.  I want to be sure she has good care.

## 2020-08-27 NOTE — Telephone Encounter (Signed)
I spoke with patient and relayed Dr. Elza Rafter message to her. In response patient stated "I do not live in fear and I do not care what Dr. Quincy Simmonds thinks my vagina looks like".  She said she created the lesion using an iodine implant and she has since quit using it and it has healed and the lesion is no longer present. She said she does not want to over tested.  She declined to schedule colposcopy.  She did say she would talk with Dr. Quincy Simmonds about it when she comes for ultrasound visit.

## 2020-08-29 ENCOUNTER — Ambulatory Visit (INDEPENDENT_AMBULATORY_CARE_PROVIDER_SITE_OTHER): Payer: No Typology Code available for payment source

## 2020-08-29 ENCOUNTER — Encounter: Payer: Self-pay | Admitting: Obstetrics and Gynecology

## 2020-08-29 ENCOUNTER — Other Ambulatory Visit: Payer: Self-pay

## 2020-08-29 ENCOUNTER — Ambulatory Visit (INDEPENDENT_AMBULATORY_CARE_PROVIDER_SITE_OTHER): Payer: No Typology Code available for payment source | Admitting: Obstetrics and Gynecology

## 2020-08-29 VITALS — BP 138/80

## 2020-08-29 DIAGNOSIS — N950 Postmenopausal bleeding: Secondary | ICD-10-CM | POA: Diagnosis not present

## 2020-08-29 DIAGNOSIS — Z79899 Other long term (current) drug therapy: Secondary | ICD-10-CM | POA: Diagnosis not present

## 2020-08-29 DIAGNOSIS — N9489 Other specified conditions associated with female genital organs and menstrual cycle: Secondary | ICD-10-CM | POA: Diagnosis not present

## 2020-08-29 DIAGNOSIS — Z79818 Long term (current) use of other agents affecting estrogen receptors and estrogen levels: Secondary | ICD-10-CM

## 2020-08-29 NOTE — Progress Notes (Signed)
GYNECOLOGY  VISIT   HPI: 70 y.o.   Single  Caucasian  female   G12P2020 with Patient's last menstrual period was 06/30/2011.   here for Pelvic ultrasound and EMB.  Patient had postmenopausal bleeding, which stopped.   She is using unopposed estrogen. She stopped taking her progesterone.  She was doing vaginal iodine self treatments in the vagina but stopped this. I noted a vaginal lesion in the vagina on 07/08/20. I recommended a colposcopy, and the patient has declined.   Hx hysteroscopy with dilation and curettage.  Records release request from Dr. Myriam Jacobson' office returned to this office indicating they do not have medical records for her.  GYNECOLOGIC HISTORY: Patient's last menstrual period was 06/30/2011. Contraception:  PMP Menopausal hormone therapy:  Estradiol Last mammogram: 10-01-11 Neg/Birads1.  She declines mammogram. Last pap smear:  04/2020 normal per patient with PCP        OB History     Gravida  5   Para  2   Term  2   Preterm      AB  2   Living         SAB  1   IAB      Ectopic      Multiple      Live Births                 There are no problems to display for this patient.   Past Medical History:  Diagnosis Date   Anxiety    Heart murmur    no prob - no med   Hypertension    Hypothyroidism    Insomnia     Past Surgical History:  Procedure Laterality Date   AUGMENTATION MAMMAPLASTY     CESAREAN SECTION  1986, 1989   COSMETIC SURGERY     face lift   dental implants     DILATATION & CURRETTAGE/HYSTEROSCOPY WITH RESECTOCOPE  11/30/2011   Procedure: DILATATION & CURETTAGE/HYSTEROSCOPY WITH RESECTOCOPE;  Surgeon: Peri Maris, MD;  Location: [facility];  Service: Gynecology;  Laterality: N/A;   EYE SURGERY     right eye    Current Outpatient Medications  Medication Sig Dispense Refill   dextroamphetamine (DEXTROSTAT) 10 MG tablet 2.5 tablets in the morning     EPINEPHrine 0.3 mg/0.3 mL IJ SOAJ injection See admin  instructions.     fosinopril (MONOPRIL) 40 MG tablet Take 40 mg by mouth daily.     liothyronine (CYTOMEL) 25 MCG tablet 1 TABLET ON AN EMPTY STOMACH ORALLY TWICE A DAY 90 DAYS-NON PHARMACY     LORazepam (ATIVAN) 2 MG tablet 2 tablets at bedtime as needed     Melatonin 10 MG TABS 1 tablet at bedtime as needed with food     metoprolol succinate (TOPROL-XL) 100 MG 24 hr tablet 1 tablet     PRESCRIPTION MEDICATION Estradiol 6 mg 1 tablet daily     No current facility-administered medications for this visit.     ALLERGIES: Tramadol hcl, Trazodone hcl, and Penicillins  History reviewed. No pertinent family history.  Social History   Socioeconomic History   Marital status: Single    Spouse name: Not on file   Number of children: Not on file   Years of education: Not on file   Highest education level: Not on file  Occupational History   Not on file  Tobacco Use   Smoking status: Never   Smokeless tobacco: Never  Vaping Use   Vaping Use:  Never used  Substance and Sexual Activity   Alcohol use: Yes    Comment: socially   Drug use: No   Sexual activity: Yes    Birth control/protection: Post-menopausal  Other Topics Concern   Not on file  Social History Narrative   Not on file   Social Determinants of Health   Financial Resource Strain: Not on file  Food Insecurity: Not on file  Transportation Needs: Not on file  Physical Activity: Not on file  Stress: Not on file  Social Connections: Not on file  Intimate Partner Violence: Not on file    Review of Systems  All other systems reviewed and are negative.  PHYSICAL EXAMINATION:    BP 138/80   LMP 06/30/2011     General appearance: alert, appears stated age.  Pelvic US  Uterus 7.33 x 4.6 x 3.72 cm.  EMS 10.68 cm.  2 cm intracavitary mass on 3D imaging.  Vascular supply to mass noted. Ovaries atrophic.  No adnexal masses.  No free fluid.  ASSESSMENT  Postmenopausal bleeding.  Endometrial mass. Hx benign  endometrial polyp removed hysteroscopically 2013.  On unopposed estradiol. Vaginal lesion.  Status post iodine treatments to the vagina.  PLAN  Etiologies of the endometrial mass discussed:  polyp, hyperplasia, and cancer of the uterus.  I have indicated I do not recommend unopposed estrogen treatment as it can cause endometrial cancer. She declines an EMB today and agrees to schedule a return visit to do EMB.  She currently declines hysteroscopy for removal of the mass and declines hysterectomy if she were to have an endometrial cancer.  I suggested she obtain a second opinion. She indicates she prefers to follow complimentary medical care.  I indicated that I respect her opinion about her care and that it is ok if we disagree on management.  For me to continue her care, we will need to be able to partner together.   An After Visit Summary was printed and given to the patient.  24 min  total time was spent for this patient encounter, including preparation, face-to-face counseling with the patient, coordination of care, and documentation of the encounter.

## 2020-08-29 NOTE — Patient Instructions (Signed)
Endometrial Biopsy An endometrial biopsy is a procedure to remove tissue samples from the endometrium, which is the lining of the uterus. The tissue that is removed can then be checked under a microscope for disease. This procedure is used to diagnose conditions such as endometrial cancer, endometrial tuberculosis, polyps, or other inflammatory conditions. This procedure may also be used to investigate uterine bleeding to determine where you are in your menstrual cycle or how your hormone levels are affecting the lining of the uterus. Tell a health care provider about: Any allergies you have. All medicines you are taking, including vitamins, herbs, eye drops, creams, and over-the-counter medicines. Any problems you or family members have had with anesthetic medicines. Any blood disorders you have. Any surgeries you have had. Any medical conditions you have. Whether you are pregnant or may be pregnant. What are the risks? Generally, this is a safe procedure. However, problems may occur, including: Bleeding. Pelvic infection. Puncture of the wall of the uterus with the biopsy device (rare). Allergic reactions to medicines. What happens before the procedure? Keep a record of your menstrual cycles as told by your health care provider. You may need to schedule your procedure for a specific time in your cycle. You may want to bring a sanitary pad to wear after the procedure. Plan to have someone take you home from the hospital or clinic. Ask your health care provider about: Changing or stopping your regular medicines. This is especially important if you are taking diabetes medicines, arthritis medicines, or blood thinners. Taking medicines such as aspirin and ibuprofen. These medicines can thin your blood. Do not take these medicines unless your health care provider tells you to take them. Taking over-the-counter medicines, vitamins, herbs, and supplements. What happens during the procedure? You  will lie on an exam table with your feet and legs supported as in a pelvic exam. Your health care provider will insert an instrument (speculum) into your vagina to see your cervix. Your cervix will be cleansed with an antiseptic solution. A medicine (local anesthetic) will be used to numb the cervix. A forceps instrument (tenaculum) will be used to hold your cervix steady for the biopsy. A thin, rod-like instrument (uterine sound) will be inserted through your cervix to determine the length of your uterus and the location where the biopsy sample will be removed. A thin, flexible tube (catheter) will be inserted through your cervix and into the uterus. The catheter will be used to collect the biopsy sample from your endometrial tissue. The catheter and speculum will then be removed, and the tissue sample will be sent to a lab for examination. The procedure may vary among health care providers and hospitals. What can I expect after procedure? You will rest in a recovery area until you are ready to go home. You may have mild cramping and a small amount of vaginal bleeding. This is normal. You may have a small amount of vaginal bleeding for a few days. This is normal. It is up to you to get the results of your procedure. Ask your health care provider, or the department that is doing the procedure, when your results will be ready. Follow these instructions at home: Take over-the-counter and prescription medicines only as told by your health care provider. Do not douche, use tampons, or have sexual intercourse until your health care provider approves. Return to your normal activities as told by your health care provider. Ask your health care provider what activities are safe for you. Follow instructions   from your health care provider about any activity restrictions, such as restrictions on strenuous exercise or heavy lifting. Keep all follow-up visits. This is important. Contact a health care  provider: You have heavy bleeding, or bleed for longer than 2 days after the procedure. You have bad smelling discharge from your vagina. You have a fever or chills. You have a burning sensation when urinating or you have difficulty urinating. You have severe pain in your lower abdomen. Get help right away if you: You have severe cramps in your stomach or back. You pass large blood clots. Your bleeding increases. You become weak or light-headed, or you faint or lose consciousness. Summary An endometrial biopsy is a procedure to remove tissue samples is taken from the endometrium, which is the lining of the uterus. The tissue sample that is removed will be checked under a microscope for disease. This procedure is used to diagnose conditions such as endometrial cancer, endometrial tuberculosis, polyps, or other inflammatory conditions. After the procedure, it is common to have mild cramping and a small amount of vaginal bleeding for a few days. Do not douche, use tampons, or have sexual intercourse until your health care provider approves. Ask your health care provider which activities are safe for you. This information is not intended to replace advice given to you by your health care provider. Make sure you discuss any questions you have with your health care provider. Document Revised: 07/24/2019 Document Reviewed: 07/24/2019 Elsevier Patient Education  2022 Elsevier Inc.  

## 2020-08-31 NOTE — Telephone Encounter (Signed)
Please contact patient to schedule her endometrial biopsy.   She was seen for postmenopausal bleeding and has an endometrial mass on ultrasound and a vaginal lesion.    She is taking unopposed estrogen and was doing self vaginal treatments with iodine, which she states she has stopped. I have recommended she stop the unopposed estrogen.  I have advised her about the potential risk of polyps, hyperplasia and cancer.  She has declined colposcopy and biopsy of the vagina.  She declined an endometrial biopsy on the day of her visit this week and stated she would schedule this appointment.   I currently do not see any future appointment.   She understands I am concerned about her health and want her to be OK.  She currently has declined both hysteroscopy with dilation and curettage and has declined hysterectomy.  She prefers to use complimentary medicine for her care.   She knows that I respect her choice.  Please let me know what the patient wishes to do so that I understand if I am able to continue her care.

## 2020-09-04 NOTE — Telephone Encounter (Signed)
Left message to call Cristoval Teall, RN at [facility], [phone].  

## 2020-09-30 NOTE — Telephone Encounter (Signed)
Left message to call Chea Malan, RN at [facility], [phone].  

## 2020-10-03 NOTE — Telephone Encounter (Signed)
Dr. Quincy Simmonds -no return call from patient. Please advise.

## 2020-10-03 NOTE — Telephone Encounter (Signed)
Please send patient a 30 day letter.

## 2020-10-15 NOTE — Telephone Encounter (Signed)
Letter reviewed and signed by Dr. Quincy Simmonds.  Letter to front office to process and mail.   Encounter closed.

## 2020-10-15 NOTE — Telephone Encounter (Signed)
Letter pended for Dr. Quincy Simmonds to review.

## 2021-01-27 ENCOUNTER — Other Ambulatory Visit: Payer: Self-pay

## 2021-01-27 ENCOUNTER — Inpatient Hospital Stay (HOSPITAL_COMMUNITY)
Admission: EM | Admit: 2021-01-27 | Discharge: 2021-02-10 | DRG: 435 | Disposition: A | Discharge status: Hospice | Payer: 59 | Attending: Internal Medicine | Admitting: Internal Medicine

## 2021-01-27 ENCOUNTER — Emergency Department (HOSPITAL_COMMUNITY): Payer: 59

## 2021-01-27 ENCOUNTER — Encounter (HOSPITAL_COMMUNITY): Payer: Self-pay

## 2021-01-27 DIAGNOSIS — K8689 Other specified diseases of pancreas: Secondary | ICD-10-CM

## 2021-01-27 DIAGNOSIS — E039 Hypothyroidism, unspecified: Secondary | ICD-10-CM

## 2021-01-27 DIAGNOSIS — Z79899 Other long term (current) drug therapy: Secondary | ICD-10-CM

## 2021-01-27 DIAGNOSIS — Y92239 Unspecified place in hospital as the place of occurrence of the external cause: Secondary | ICD-10-CM | POA: Diagnosis not present

## 2021-01-27 DIAGNOSIS — I82890 Acute embolism and thrombosis of other specified veins: Secondary | ICD-10-CM

## 2021-01-27 DIAGNOSIS — G893 Neoplasm related pain (acute) (chronic): Secondary | ICD-10-CM | POA: Diagnosis present

## 2021-01-27 DIAGNOSIS — K922 Gastrointestinal hemorrhage, unspecified: Secondary | ICD-10-CM | POA: Insufficient documentation

## 2021-01-27 DIAGNOSIS — K5903 Drug induced constipation: Secondary | ICD-10-CM | POA: Diagnosis not present

## 2021-01-27 DIAGNOSIS — Z515 Encounter for palliative care: Secondary | ICD-10-CM

## 2021-01-27 DIAGNOSIS — Y92009 Unspecified place in unspecified non-institutional (private) residence as the place of occurrence of the external cause: Secondary | ICD-10-CM

## 2021-01-27 DIAGNOSIS — R8271 Bacteriuria: Secondary | ICD-10-CM | POA: Diagnosis present

## 2021-01-27 DIAGNOSIS — C259 Malignant neoplasm of pancreas, unspecified: Secondary | ICD-10-CM | POA: Diagnosis not present

## 2021-01-27 DIAGNOSIS — K5900 Constipation, unspecified: Secondary | ICD-10-CM

## 2021-01-27 DIAGNOSIS — Z66 Do not resuscitate: Secondary | ICD-10-CM | POA: Diagnosis not present

## 2021-01-27 DIAGNOSIS — R140 Abdominal distension (gaseous): Secondary | ICD-10-CM

## 2021-01-27 DIAGNOSIS — T39395A Adverse effect of other nonsteroidal anti-inflammatory drugs [NSAID], initial encounter: Secondary | ICD-10-CM | POA: Diagnosis present

## 2021-01-27 DIAGNOSIS — R935 Abnormal findings on diagnostic imaging of other abdominal regions, including retroperitoneum: Secondary | ICD-10-CM

## 2021-01-27 DIAGNOSIS — N390 Urinary tract infection, site not specified: Secondary | ICD-10-CM | POA: Diagnosis not present

## 2021-01-27 DIAGNOSIS — Z79890 Hormone replacement therapy (postmenopausal): Secondary | ICD-10-CM

## 2021-01-27 DIAGNOSIS — K319 Disease of stomach and duodenum, unspecified: Secondary | ICD-10-CM | POA: Diagnosis present

## 2021-01-27 DIAGNOSIS — R188 Other ascites: Secondary | ICD-10-CM | POA: Diagnosis present

## 2021-01-27 DIAGNOSIS — K769 Liver disease, unspecified: Secondary | ICD-10-CM

## 2021-01-27 DIAGNOSIS — T402X5A Adverse effect of other opioids, initial encounter: Secondary | ICD-10-CM | POA: Diagnosis not present

## 2021-01-27 DIAGNOSIS — J90 Pleural effusion, not elsewhere classified: Secondary | ICD-10-CM | POA: Diagnosis present

## 2021-01-27 DIAGNOSIS — D62 Acute posthemorrhagic anemia: Secondary | ICD-10-CM | POA: Diagnosis not present

## 2021-01-27 DIAGNOSIS — C787 Secondary malignant neoplasm of liver and intrahepatic bile duct: Secondary | ICD-10-CM | POA: Diagnosis present

## 2021-01-27 DIAGNOSIS — G9341 Metabolic encephalopathy: Secondary | ICD-10-CM

## 2021-01-27 DIAGNOSIS — Z800 Family history of malignant neoplasm of digestive organs: Secondary | ICD-10-CM

## 2021-01-27 DIAGNOSIS — D378 Neoplasm of uncertain behavior of other specified digestive organs: Secondary | ICD-10-CM | POA: Diagnosis present

## 2021-01-27 DIAGNOSIS — K209 Esophagitis, unspecified without bleeding: Secondary | ICD-10-CM | POA: Diagnosis present

## 2021-01-27 DIAGNOSIS — Z20822 Contact with and (suspected) exposure to covid-19: Secondary | ICD-10-CM | POA: Diagnosis present

## 2021-01-27 DIAGNOSIS — I81 Portal vein thrombosis: Secondary | ICD-10-CM | POA: Diagnosis present

## 2021-01-27 DIAGNOSIS — K921 Melena: Secondary | ICD-10-CM

## 2021-01-27 DIAGNOSIS — G928 Other toxic encephalopathy: Secondary | ICD-10-CM | POA: Diagnosis not present

## 2021-01-27 DIAGNOSIS — Z880 Allergy status to penicillin: Secondary | ICD-10-CM

## 2021-01-27 DIAGNOSIS — E063 Autoimmune thyroiditis: Secondary | ICD-10-CM | POA: Diagnosis present

## 2021-01-27 DIAGNOSIS — F419 Anxiety disorder, unspecified: Secondary | ICD-10-CM | POA: Diagnosis present

## 2021-01-27 DIAGNOSIS — K2961 Other gastritis with bleeding: Secondary | ICD-10-CM | POA: Diagnosis present

## 2021-01-27 DIAGNOSIS — C801 Malignant (primary) neoplasm, unspecified: Secondary | ICD-10-CM | POA: Diagnosis present

## 2021-01-27 DIAGNOSIS — Z885 Allergy status to narcotic agent status: Secondary | ICD-10-CM

## 2021-01-27 DIAGNOSIS — R109 Unspecified abdominal pain: Secondary | ICD-10-CM

## 2021-01-27 DIAGNOSIS — I10 Essential (primary) hypertension: Secondary | ICD-10-CM | POA: Diagnosis present

## 2021-01-27 DIAGNOSIS — T40605A Adverse effect of unspecified narcotics, initial encounter: Secondary | ICD-10-CM | POA: Diagnosis not present

## 2021-01-27 LAB — COMPREHENSIVE METABOLIC PANEL
ALT: 24 U/L (ref 0–44)
AST: 20 U/L (ref 15–41)
Albumin: 3.8 g/dL (ref 3.5–5.0)
Alkaline Phosphatase: 84 U/L (ref 38–126)
Anion gap: 8 (ref 5–15)
BUN: 25 mg/dL — ABNORMAL HIGH (ref 8–23)
CO2: 27 mmol/L (ref 22–32)
Calcium: 9.4 mg/dL (ref 8.9–10.3)
Chloride: 100 mmol/L (ref 98–111)
Creatinine, Ser: 0.77 mg/dL (ref 0.44–1.00)
GFR, Estimated: >60 mL/min (ref >60)
Glucose, Bld: 122 mg/dL — ABNORMAL HIGH (ref 70–99)
Potassium: 4.3 mmol/L (ref 3.5–5.1)
Sodium: 135 mmol/L (ref 135–145)
Total Bilirubin: 0.6 mg/dL (ref 0.3–1.2)
Total Protein: 6.8 g/dL (ref 6.5–8.1)

## 2021-01-27 LAB — CBC WITH DIFFERENTIAL/PLATELET
Abs Immature Granulocytes: 0.04 10*3/uL (ref 0.00–0.07)
Basophils Absolute: 0 10*3/uL (ref 0.0–0.1)
Basophils Relative: 0 %
Eosinophils Absolute: 0.1 10*3/uL (ref 0.0–0.5)
Eosinophils Relative: 1 %
HCT: 37.9 % (ref 36.0–46.0)
Hemoglobin: 12.7 g/dL (ref 12.0–15.0)
Immature Granulocytes: 0 %
Lymphocytes Relative: 24 %
Lymphs Abs: 2.6 10*3/uL (ref 0.7–4.0)
MCH: 27 pg (ref 26.0–34.0)
MCHC: 33.5 g/dL (ref 30.0–36.0)
MCV: 80.6 fL (ref 80.0–100.0)
Monocytes Absolute: 0.6 10*3/uL (ref 0.1–1.0)
Monocytes Relative: 6 %
Neutro Abs: 7.4 10*3/uL (ref 1.7–7.7)
Neutrophils Relative %: 69 %
Platelets: 229 10*3/uL (ref 150–400)
RBC: 4.7 MIL/uL (ref 3.87–5.11)
RDW: 13.1 % (ref 11.5–15.5)
WBC: 10.7 10*3/uL — ABNORMAL HIGH (ref 4.0–10.5)
nRBC: 0 % (ref 0.0–0.2)

## 2021-01-27 LAB — TYPE AND SCREEN
ABO/RH(D): O POS
Antibody Screen: NEGATIVE

## 2021-01-27 LAB — LIPASE, BLOOD: Lipase: 21 U/L (ref 11–51)

## 2021-01-27 MED ORDER — IOHEXOL 300 MG/ML  SOLN
100.0000 mL | Freq: Once | INTRAMUSCULAR | Status: AC | PRN
  Administered 2021-01-27: 100 mL via INTRAVENOUS

## 2021-01-27 NOTE — ED Provider Triage Note (Signed)
Emergency Medicine Provider Triage Evaluation Note  Gloria Ward , a 71 y.o. female  was evaluated in triage.  Pt complains of gradual onset, constant, worsening, diffuse abdominal pain status post tummy tuck done in October of this year in [place].  She reports that she saw her surgeon afterwards and he reported that everything looked like it was healing well and that her pain was normal.  She states the pain is gotten worse since then.  She now complains of bilateral sciatica pain however states she does not have history of same.  She states that she has back pain radiating into her bilateral hips.  She also complains of dark black stool that she noticed this morning.  She denies any recent Pepto-Bismol or Tums use.  She denies any history of GI bleed.  No coffee-ground emesis or hematemesis.   She does mention having URI-like symptoms about 1 to 2 weeks ago however tested negative for COVID.  Review of Systems  Positive: + abd pain, back pain, black stool Negative: - vomiting  Physical Exam  BP (!) 163/89 (BP Location: Left Arm)    Pulse 87    Temp 98.7 F (37.1 C) (Oral)    Resp 16    Ht 5' 2.5" (1.588 m)    Wt 77.1 kg    LMP 06/30/2011    SpO2 99%    BMI 30.60 kg/m  Gen:   Awake, no distress   Resp:  Normal effort  MSK:   Moves extremities without difficulty  Other:  Diffuse abd TTP  Medical Decision Making  Medically screening exam initiated at 6:32 PM.  Appropriate orders placed.  Gloria Ward was informed that the remainder of the evaluation will be completed by another provider, this initial triage assessment does not replace that evaluation, and the importance of remaining in the ED until their evaluation is complete.     Eustaquio Maize, PA-C 01/27/21 1834

## 2021-01-27 NOTE — ED Triage Notes (Addendum)
Patient reports that she had a "tummy Tuck in October." Patient c/o abdominal pain and states it has gotten gradually worse since her surgery. Patient also noted black stools today.  Patient stated, "I am not able to walk any distance without my insides feeling like they are going to fall out."  Patient also c/o sciatica pain bilaterally and states it just runs into her hips.    Patienat states she took a Percocet 10/325 tab at 1700 today in order to come to the ED.

## 2021-01-28 ENCOUNTER — Encounter (HOSPITAL_COMMUNITY): Payer: Self-pay | Admitting: Family Medicine

## 2021-01-28 ENCOUNTER — Inpatient Hospital Stay (HOSPITAL_COMMUNITY): Payer: 59

## 2021-01-28 DIAGNOSIS — F419 Anxiety disorder, unspecified: Secondary | ICD-10-CM | POA: Diagnosis present

## 2021-01-28 DIAGNOSIS — K921 Melena: Secondary | ICD-10-CM

## 2021-01-28 DIAGNOSIS — Z515 Encounter for palliative care: Secondary | ICD-10-CM | POA: Diagnosis not present

## 2021-01-28 DIAGNOSIS — E039 Hypothyroidism, unspecified: Secondary | ICD-10-CM | POA: Diagnosis not present

## 2021-01-28 DIAGNOSIS — I10 Essential (primary) hypertension: Secondary | ICD-10-CM | POA: Diagnosis present

## 2021-01-28 DIAGNOSIS — C787 Secondary malignant neoplasm of liver and intrahepatic bile duct: Secondary | ICD-10-CM | POA: Diagnosis present

## 2021-01-28 DIAGNOSIS — Z885 Allergy status to narcotic agent status: Secondary | ICD-10-CM | POA: Diagnosis not present

## 2021-01-28 DIAGNOSIS — K209 Esophagitis, unspecified without bleeding: Secondary | ICD-10-CM | POA: Diagnosis present

## 2021-01-28 DIAGNOSIS — C801 Malignant (primary) neoplasm, unspecified: Secondary | ICD-10-CM | POA: Diagnosis present

## 2021-01-28 DIAGNOSIS — K5900 Constipation, unspecified: Secondary | ICD-10-CM | POA: Diagnosis not present

## 2021-01-28 DIAGNOSIS — Y92239 Unspecified place in hospital as the place of occurrence of the external cause: Secondary | ICD-10-CM | POA: Diagnosis not present

## 2021-01-28 DIAGNOSIS — K922 Gastrointestinal hemorrhage, unspecified: Secondary | ICD-10-CM | POA: Insufficient documentation

## 2021-01-28 DIAGNOSIS — I82890 Acute embolism and thrombosis of other specified veins: Secondary | ICD-10-CM | POA: Diagnosis not present

## 2021-01-28 DIAGNOSIS — Z79899 Other long term (current) drug therapy: Secondary | ICD-10-CM | POA: Diagnosis not present

## 2021-01-28 DIAGNOSIS — Z7189 Other specified counseling: Secondary | ICD-10-CM | POA: Diagnosis not present

## 2021-01-28 DIAGNOSIS — G893 Neoplasm related pain (acute) (chronic): Secondary | ICD-10-CM | POA: Diagnosis present

## 2021-01-28 DIAGNOSIS — K319 Disease of stomach and duodenum, unspecified: Secondary | ICD-10-CM | POA: Diagnosis present

## 2021-01-28 DIAGNOSIS — R188 Other ascites: Secondary | ICD-10-CM | POA: Diagnosis present

## 2021-01-28 DIAGNOSIS — Z880 Allergy status to penicillin: Secondary | ICD-10-CM | POA: Diagnosis not present

## 2021-01-28 DIAGNOSIS — G9341 Metabolic encephalopathy: Secondary | ICD-10-CM | POA: Diagnosis not present

## 2021-01-28 DIAGNOSIS — G928 Other toxic encephalopathy: Secondary | ICD-10-CM | POA: Diagnosis not present

## 2021-01-28 DIAGNOSIS — K2961 Other gastritis with bleeding: Secondary | ICD-10-CM | POA: Diagnosis present

## 2021-01-28 DIAGNOSIS — J90 Pleural effusion, not elsewhere classified: Secondary | ICD-10-CM | POA: Diagnosis present

## 2021-01-28 DIAGNOSIS — R338 Other retention of urine: Secondary | ICD-10-CM | POA: Diagnosis not present

## 2021-01-28 DIAGNOSIS — R935 Abnormal findings on diagnostic imaging of other abdominal regions, including retroperitoneum: Secondary | ICD-10-CM | POA: Diagnosis not present

## 2021-01-28 DIAGNOSIS — N390 Urinary tract infection, site not specified: Secondary | ICD-10-CM | POA: Diagnosis present

## 2021-01-28 DIAGNOSIS — I81 Portal vein thrombosis: Secondary | ICD-10-CM

## 2021-01-28 DIAGNOSIS — D62 Acute posthemorrhagic anemia: Secondary | ICD-10-CM | POA: Diagnosis not present

## 2021-01-28 DIAGNOSIS — K5903 Drug induced constipation: Secondary | ICD-10-CM | POA: Diagnosis not present

## 2021-01-28 DIAGNOSIS — D378 Neoplasm of uncertain behavior of other specified digestive organs: Secondary | ICD-10-CM | POA: Diagnosis present

## 2021-01-28 DIAGNOSIS — R8271 Bacteriuria: Secondary | ICD-10-CM | POA: Diagnosis present

## 2021-01-28 DIAGNOSIS — T40605A Adverse effect of unspecified narcotics, initial encounter: Secondary | ICD-10-CM | POA: Diagnosis not present

## 2021-01-28 DIAGNOSIS — Y92009 Unspecified place in unspecified non-institutional (private) residence as the place of occurrence of the external cause: Secondary | ICD-10-CM | POA: Diagnosis not present

## 2021-01-28 DIAGNOSIS — Z79890 Hormone replacement therapy (postmenopausal): Secondary | ICD-10-CM | POA: Diagnosis not present

## 2021-01-28 DIAGNOSIS — R52 Pain, unspecified: Secondary | ICD-10-CM | POA: Diagnosis not present

## 2021-01-28 DIAGNOSIS — Z66 Do not resuscitate: Secondary | ICD-10-CM | POA: Diagnosis not present

## 2021-01-28 DIAGNOSIS — Z20822 Contact with and (suspected) exposure to covid-19: Secondary | ICD-10-CM | POA: Diagnosis present

## 2021-01-28 DIAGNOSIS — R1084 Generalized abdominal pain: Secondary | ICD-10-CM | POA: Diagnosis not present

## 2021-01-28 DIAGNOSIS — C259 Malignant neoplasm of pancreas, unspecified: Secondary | ICD-10-CM | POA: Diagnosis present

## 2021-01-28 DIAGNOSIS — E063 Autoimmune thyroiditis: Secondary | ICD-10-CM | POA: Diagnosis present

## 2021-01-28 DIAGNOSIS — K769 Liver disease, unspecified: Secondary | ICD-10-CM | POA: Diagnosis not present

## 2021-01-28 DIAGNOSIS — R109 Unspecified abdominal pain: Secondary | ICD-10-CM

## 2021-01-28 LAB — URINALYSIS, ROUTINE W REFLEX MICROSCOPIC
Bilirubin Urine: NEGATIVE
Glucose, UA: NEGATIVE mg/dL
Hgb urine dipstick: NEGATIVE
Ketones, ur: 5 mg/dL — AB
Nitrite: NEGATIVE
Protein, ur: NEGATIVE mg/dL
Specific Gravity, Urine: >1.046 — ABNORMAL HIGH (ref 1.005–1.030)
pH: 7 (ref 5.0–8.0)

## 2021-01-28 LAB — RESP PANEL BY RT-PCR (FLU A&B, COVID) ARPGX2
Influenza A by PCR: NEGATIVE
Influenza B by PCR: NEGATIVE
SARS Coronavirus 2 by RT PCR: NEGATIVE

## 2021-01-28 LAB — COMPREHENSIVE METABOLIC PANEL
ALT: 22 U/L (ref 0–44)
AST: 19 U/L (ref 15–41)
Albumin: 3.4 g/dL — ABNORMAL LOW (ref 3.5–5.0)
Alkaline Phosphatase: 76 U/L (ref 38–126)
Anion gap: 6 (ref 5–15)
BUN: 23 mg/dL (ref 8–23)
CO2: 25 mmol/L (ref 22–32)
Calcium: 8.6 mg/dL — ABNORMAL LOW (ref 8.9–10.3)
Chloride: 105 mmol/L (ref 98–111)
Creatinine, Ser: 0.64 mg/dL (ref 0.44–1.00)
GFR, Estimated: >60 mL/min (ref >60)
Glucose, Bld: 123 mg/dL — ABNORMAL HIGH (ref 70–99)
Potassium: 4.1 mmol/L (ref 3.5–5.1)
Sodium: 136 mmol/L (ref 135–145)
Total Bilirubin: 0.5 mg/dL (ref 0.3–1.2)
Total Protein: 6.4 g/dL — ABNORMAL LOW (ref 6.5–8.1)

## 2021-01-28 LAB — CBC
HCT: 36 % (ref 36.0–46.0)
HCT: 34.2 % — ABNORMAL LOW (ref 36.0–46.0)
Hemoglobin: 11.7 g/dL — ABNORMAL LOW (ref 12.0–15.0)
Hemoglobin: 11.1 g/dL — ABNORMAL LOW (ref 12.0–15.0)
MCH: 26.7 pg (ref 26.0–34.0)
MCH: 26.7 pg (ref 26.0–34.0)
MCHC: 32.5 g/dL (ref 30.0–36.0)
MCHC: 32.5 g/dL (ref 30.0–36.0)
MCV: 82 fL (ref 80.0–100.0)
MCV: 82.2 fL (ref 80.0–100.0)
Platelets: 209 10*3/uL (ref 150–400)
Platelets: 180 10*3/uL (ref 150–400)
RBC: 4.39 MIL/uL (ref 3.87–5.11)
RBC: 4.16 MIL/uL (ref 3.87–5.11)
RDW: 13.2 % (ref 11.5–15.5)
RDW: 13.2 % (ref 11.5–15.5)
WBC: 9.5 10*3/uL (ref 4.0–10.5)
WBC: 8.3 10*3/uL (ref 4.0–10.5)
nRBC: 0 % (ref 0.0–0.2)
nRBC: 0 % (ref 0.0–0.2)

## 2021-01-28 LAB — PROTIME-INR
INR: 1 (ref 0.8–1.2)
Prothrombin Time: 13.2 seconds (ref 11.4–15.2)

## 2021-01-28 LAB — ABO/RH: ABO/RH(D): O POS

## 2021-01-28 LAB — APTT: aPTT: 24 seconds (ref 24–36)

## 2021-01-28 LAB — HIV ANTIBODY (ROUTINE TESTING W REFLEX): HIV Screen 4th Generation wRfx: NONREACTIVE

## 2021-01-28 LAB — POC OCCULT BLOOD, ED: Fecal Occult Bld: POSITIVE — AB

## 2021-01-28 MED ORDER — MELATONIN 5 MG PO TABS
10.0000 mg | ORAL_TABLET | Freq: Every evening | ORAL | Status: DC | PRN
  Administered 2021-02-05 – 2021-02-10 (×4): 10 mg via ORAL
  Filled 2021-01-28 (×4): qty 2

## 2021-01-28 MED ORDER — HYDROMORPHONE HCL 1 MG/ML IJ SOLN
0.5000 mg | INTRAMUSCULAR | Status: AC | PRN
  Administered 2021-01-28 – 2021-01-29 (×2): 0.5 mg via INTRAVENOUS
  Filled 2021-01-28 (×2): qty 0.5

## 2021-01-28 MED ORDER — PANTOPRAZOLE SODIUM 40 MG IV SOLR
40.0000 mg | INTRAVENOUS | Status: AC
  Administered 2021-01-28 – 2021-01-29 (×2): 40 mg via INTRAVENOUS
  Filled 2021-01-28 (×2): qty 40

## 2021-01-28 MED ORDER — ACETAMINOPHEN 650 MG RE SUPP: 650.0000 mg | Freq: Four times a day (QID) | RECTAL | Status: DC | PRN

## 2021-01-28 MED ORDER — HYDROMORPHONE HCL 1 MG/ML IJ SOLN
1.0000 mg | Freq: Once | INTRAMUSCULAR | Status: AC
  Administered 2021-01-28: 1 mg via INTRAVENOUS
  Filled 2021-01-28: qty 1

## 2021-01-28 MED ORDER — ACETAMINOPHEN 325 MG PO TABS: 650.0000 mg | ORAL_TABLET | Freq: Four times a day (QID) | ORAL | Status: DC | PRN

## 2021-01-28 MED ORDER — SODIUM CHLORIDE 0.9 % IV BOLUS
1000.0000 mL | Freq: Once | INTRAVENOUS | Status: AC
Start: 2021-01-28 — End: 2021-01-28
  Administered 2021-01-28: 1000 mL via INTRAVENOUS

## 2021-01-28 MED ORDER — ONDANSETRON HCL 4 MG/2ML IJ SOLN
4.0000 mg | Freq: Four times a day (QID) | INTRAMUSCULAR | Status: DC | PRN
  Administered 2021-01-30 – 2021-01-31 (×3): 4 mg via INTRAVENOUS
  Filled 2021-01-28 (×3): qty 2

## 2021-01-28 MED ORDER — HYDROMORPHONE HCL 1 MG/ML IJ SOLN
1.0000 mg | INTRAMUSCULAR | Status: AC | PRN
  Administered 2021-01-28 (×4): 1 mg via INTRAVENOUS
  Filled 2021-01-28 (×4): qty 1

## 2021-01-28 MED ORDER — PANTOPRAZOLE SODIUM 40 MG IV SOLR
40.0000 mg | Freq: Once | INTRAVENOUS | Status: AC
  Administered 2021-01-28: 40 mg via INTRAVENOUS
  Filled 2021-01-28: qty 40

## 2021-01-28 MED ORDER — GADOBUTROL 1 MMOL/ML IV SOLN
7.0000 mL | Freq: Once | INTRAVENOUS | Status: AC | PRN
  Administered 2021-01-28: 7 mL via INTRAVENOUS

## 2021-01-28 MED ORDER — LIOTHYRONINE SODIUM 25 MCG PO TABS
25.0000 ug | ORAL_TABLET | Freq: Every day | ORAL | Status: DC
  Administered 2021-01-28 – 2021-01-30 (×3): 25 ug via ORAL
  Filled 2021-01-28 (×3): qty 1

## 2021-01-28 MED ORDER — HEPARIN (PORCINE) 25000 UT/250ML-% IV SOLN
1100.0000 [IU]/h | INTRAVENOUS | Status: DC
  Administered 2021-01-28: 1100 [IU]/h via INTRAVENOUS
  Filled 2021-01-28: qty 250

## 2021-01-28 MED ORDER — TRAMADOL HCL 50 MG PO TABS
50.0000 mg | ORAL_TABLET | Freq: Two times a day (BID) | ORAL | Status: DC | PRN
  Filled 2021-01-28: qty 1

## 2021-01-28 MED ORDER — ONDANSETRON HCL 4 MG PO TABS: 4.0000 mg | ORAL_TABLET | Freq: Four times a day (QID) | ORAL | Status: DC | PRN

## 2021-01-28 MED ORDER — LACTATED RINGERS IV SOLN: INTRAVENOUS | Status: DC

## 2021-01-28 MED ORDER — SODIUM CHLORIDE 0.9 % IV SOLN
1.0000 g | Freq: Once | INTRAVENOUS | Status: AC
  Administered 2021-01-28: 1 g via INTRAVENOUS
  Filled 2021-01-28: qty 10

## 2021-01-28 MED ORDER — SENNOSIDES-DOCUSATE SODIUM 8.6-50 MG PO TABS: 1.0000 | ORAL_TABLET | Freq: Every evening | ORAL | Status: DC | PRN

## 2021-01-28 NOTE — ED Provider Notes (Addendum)
[name] Provider Note  CSN: [id number] Arrival date & time: 01/27/21 1752  Chief Complaint(s) Abdominal Pain, dark stools, and Back Pain  HPI Gloria Ward is a 71 y.o. female with a past medical history of listed below who recently had an abdominoplasty approximately 4 months ago who has had a gradually worsening lower abdominal pain here for severe lower abdominal pain.  Pain worse over the past several days.  Now also associated with right lower back pain.  Endorses nausea without emesis.  Reports black stools.  Patient's not anticoagulated.  No history of ulcers. Reports h/o anal fissure and streaky reddish blood on  No urinary symptoms.  No fevers.   Abdominal Pain Back Pain Associated symptoms: abdominal pain    Past Medical History Past Medical History:  Diagnosis Date   Anxiety    Heart murmur    no prob - no med   Hypertension    Hypothyroidism    Insomnia    There are no problems to display for this patient.  Home Medication(s) Prior to Admission medications   Medication Sig Start Date End Date Taking? Authorizing Provider  dextroamphetamine (DEXTROSTAT) 10 MG tablet 2.5 tablets in the morning 04/04/20   [provider]  EPINEPHrine 0.3 mg/0.3 mL IJ SOAJ injection See admin instructions. 12/09/16   [provider]  fosinopril (MONOPRIL) 40 MG tablet Take 40 mg by mouth daily. 06/24/20   [provider]  liothyronine (CYTOMEL) 25 MCG tablet 1 TABLET ON AN EMPTY STOMACH ORALLY TWICE A DAY 82 DAYS-NON PHARMACY    [provider]  LORazepam (ATIVAN) 2 MG tablet 2 tablets at bedtime as needed 05/06/20   [provider]  Melatonin 10 MG TABS 1 tablet at bedtime as needed with food 12/09/16   [provider]  metoprolol succinate (TOPROL-XL) 100 MG 24 hr tablet 1 tablet    [provider]  PRESCRIPTION MEDICATION Estradiol 6 mg 1 tablet daily    [provider]                                                                                                                                     Allergies Tramadol hcl, Trazodone hcl, and Penicillins  Review of Systems Review of Systems  Gastrointestinal:  Positive for abdominal pain.  Musculoskeletal:  Positive for back pain.  As noted in HPI  Physical Exam Vital Signs  I have reviewed the triage vital signs BP 140/69    Pulse 93    Temp 98.7 F (37.1 C) (Oral)    Resp 15    Ht 5' 2.5" (1.588 m)    Wt 77.1 kg    LMP 06/30/2011    SpO2 97%    BMI 30.60 kg/m   Physical Exam Vitals reviewed. Exam conducted with a chaperone present.  Constitutional:      General: She is not in acute distress.  Appearance: She is well-developed. She is not diaphoretic.  HENT:     Head: Normocephalic and atraumatic.     Right Ear: External ear normal.     Left Ear: External ear normal.     Nose: Nose normal.  Eyes:     General: No scleral icterus.    Conjunctiva/sclera: Conjunctivae normal.  Neck:     Trachea: Phonation normal.  Cardiovascular:     Rate and Rhythm: Normal rate and regular rhythm.  Pulmonary:     Effort: Pulmonary effort is normal. No respiratory distress.     Breath sounds: No stridor.  Abdominal:     General: There is no distension.     Tenderness: There is abdominal tenderness in the right lower quadrant, suprapubic area and left lower quadrant. There is no rebound.       Comments: Abdominoplasty scars are well appearing w/o surrounding erythema.  Genitourinary:    Comments: Dark Bottomley stool, but hemoccult + Musculoskeletal:        General: Normal range of motion.     Cervical back: Normal range of motion.  Neurological:     Mental Status: She is alert and oriented to person, place, and time.  Psychiatric:        Behavior: Behavior normal.    ED Results and Treatments Labs (all labs ordered are listed, but only abnormal results are displayed) Labs Reviewed  COMPREHENSIVE  METABOLIC PANEL - Abnormal; Notable for the following components:      Result Value   Glucose, Bld 122 (*)    BUN 25 (*)    All other components within normal limits  CBC WITH DIFFERENTIAL/PLATELET - Abnormal; Notable for the following components:   WBC 10.7 (*)    All other components within normal limits  URINALYSIS, ROUTINE W REFLEX MICROSCOPIC - Abnormal; Notable for the following components:   APPearance HAZY (*)    Specific Gravity, Urine >1.046 (*)    Ketones, ur 5 (*)    Leukocytes,Ua TRACE (*)    Bacteria, UA RARE (*)    All other components within normal limits  POC OCCULT BLOOD, ED - Abnormal; Notable for the following components:   Fecal Occult Bld POSITIVE (*)    All other components within normal limits  RESP PANEL BY RT-PCR (FLU A&B, COVID) ARPGX2  LIPASE, BLOOD  APTT  PROTIME-INR  HEPARIN LEVEL (UNFRACTIONATED)  CBC  TYPE AND SCREEN  ABO/RH                                                                                                                         EKG  EKG Interpretation  Date/Time:    Ventricular Rate:    PR Interval:    QRS Duration:   QT Interval:    QTC Calculation:   R Axis:     Text Interpretation:         Radiology CT Abdomen Pelvis W Contrast  Result Date: 01/27/2021 CLINICAL DATA:  Abdominal pain. EXAM:  CT ABDOMEN AND PELVIS WITH CONTRAST TECHNIQUE: Multidetector CT imaging of the abdomen and pelvis was performed using the standard protocol following bolus administration of intravenous contrast. RADIATION DOSE REDUCTION: This exam was performed according to the departmental dose-optimization program which includes automated exposure control, adjustment of the mA and/or kV according to patient size and/or use of iterative reconstruction technique. CONTRAST:  193mL OMNIPAQUE IOHEXOL 300 MG/ML  SOLN COMPARISON:  None. FINDINGS: Lower chest: No acute abnormality. Hepatobiliary: No focal liver abnormality is seen. A 1.7 cm x 1.7 cm x 1.9 cm  low-attenuation soft tissue mass is seen within the periportal region (approximately 32.62 Hounsfield units). Mass effect on the portal vein is noted with subsequent marked severity portal vein narrowing (sagittal reformatted images 74 through 78, CT series 5). No gallstones, gallbladder wall thickening, or biliary dilatation. Pancreas: Adjacent 2.6 cm x 1.8 cm x 2.2 cm and 2.8 cm x 1.4 cm x 1.2 cm low-attenuation pancreatic masses are seen within the pancreatic tail (approximately 32.19 Hounsfield units). There is no evidence of pancreatic ductal dilatation. Spleen: Normal in size without focal abnormality. Adrenals/Urinary Tract: Adrenal glands are unremarkable. Kidneys are normal, without renal calculi, focal lesion, or hydronephrosis. Bladder is unremarkable. Stomach/Bowel: Stomach is within normal limits. Appendix appears normal. A large amount of stool is seen throughout the large bowel. This is most prominent within the sigmoid colon. No evidence of bowel wall thickening, distention, or inflammatory changes. Vascular/Lymphatic: Aortic atherosclerosis. Extensive intraluminal filling defects are seen within the splenic vein. No enlarged abdominal or pelvic lymph nodes. Reproductive: Uterus and bilateral adnexa are unremarkable. Other: No abdominal wall hernia or abnormality. No abdominopelvic ascites. Musculoskeletal: Marked severity degenerative changes are seen at the level of L5-S1. IMPRESSION: 1. Marked amount of splenic vein thrombus. 2. Pancreatic masses, as described above, worrisome for an underlying pancreatic neoplasm. MRI correlation is recommended. 3. Low attenuation periportal lesion concerning for metastatic disease with subsequent marked severity narrowing of the portal vein. 4. Aortic atherosclerosis. 5. Marked severity degenerative changes at the level of L5-S1. Aortic Atherosclerosis (ICD10-I70.0). Electronically Signed   By: Virgina Norfolk M.D.   On: 01/27/2021 20:22    Pertinent labs &  imaging results that were available during my care of the patient were reviewed by me and considered in my medical decision making (see MDM for details).  Medications Ordered in ED Medications  heparin ADULT infusion 100 units/mL (25000 units/219mL) (1,100 Units/hr Intravenous New Bag/Given 01/28/21 0152)  cefTRIAXone (ROCEPHIN) 1 g in sodium chloride 0.9 % 100 mL IVPB (has no administration in time range)  pantoprazole (PROTONIX) injection 40 mg (has no administration in time range)  iohexol (OMNIPAQUE) 300 MG/ML solution 100 mL (100 mLs Intravenous Contrast Given 01/27/21 1946)  sodium chloride 0.9 % bolus 1,000 mL (1,000 mLs Intravenous New Bag/Given 01/28/21 0153)  HYDROmorphone (DILAUDID) injection 1 mg (1 mg Intravenous Given 01/28/21 0130)  Procedures .Critical Care Performed by: Fatima Blank, MD Authorized by: Fatima Blank, MD   Critical care provider statement:    Critical care time (minutes):  30   Critical care was necessary to treat or prevent imminent or life-threatening deterioration of the following conditions: splenic vein thrombus requiring IV heparin.   Critical care was time spent personally by me on the following activities:  Development of treatment plan with patient or surrogate, discussions with consultants, evaluation of patient's response to treatment, examination of patient, ordering and review of laboratory studies, ordering and review of radiographic studies, ordering and performing treatments and interventions, pulse oximetry, re-evaluation of patient's condition and review of old charts   Care discussed with: admitting provider    (including critical care time)  Medical Decision Making / ED Course        Lower abd pain Associated with right flank pain Surgical wounds and abdominal wall do not appear to be  infected. Will assess for any evidence of intra-abdominal inflammatory/infectious process. Will also assess for urinary tract infection/pyelonephritis. Dark stools.   Work-up ordered to assess concerns above.  Labs and imaging independently interpreted by me and noted below: CBC with leukocytosis. No anemia No significant electrolyte derangements or renal sufficiency noted on the metabolic panel No evidence of bili obstruction or pancreatitis. UA with mild leukocytes and elevated WBCs. CT scan without evidence of obstruction, colitis, diverticulitis.  Radiology reported large splenic vein thrombus as well as a pancreatic mass with periportal lesion concerning for metastatic disease.  Management: Rocephin for possible UTI IV pain medicine given IVF Hep gtt for thrombus. Dark Heindl stool and hemoccult +. Hb stabl, but will need to monitor Hb closely. Given IV protonics Medicine requested holding off on AC until GI assesses for GI bleed. Admit for further work up and management of thrombus and pancreatic mass  Reassessment: Pain improved.   Final Clinical Impression(s) / ED Diagnoses Final diagnoses:  Splenic vein thrombosis  Pancreatic mass  Lower urinary tract infectious disease           This chart was dictated using voice recognition software.  Despite best efforts to proofread,  errors can occur which can change the documentation meaning.      Fatima Blank, MD 01/28/21 [phone]

## 2021-01-28 NOTE — Progress Notes (Signed)
PROGRESS NOTE    Gloria Ward  IOM:[id number] DOB: 1950-02-16 DOA: 01/27/2021 PCP: Lawerance Cruel, MD   Brief Narrative:  Gloria Ward is a 71 y.o. female with medical history significant for HTN, hypothyroidism presents for evaluation of severe abdominal pain, worsening over the past 2 weeks, profoundly worse over the last 5 days.  Pain has been associated with black tarry stools over the past few days, FOBT positive in the ED.  GI called for consult, hospitalist called for admit.  Patient has been taking ibuprofen and naproxen over the past 2 weeks quite profusely in the setting of abdominal pain.  Routine imaging in the ED including CT abdomen pelvis shows pancreatic tail mass concerning for neoplastic process.  Assessment & Plan:   Intractable abdominal pain  Secondary to mass/lesion of uncertain behavior of tail of pancreas - Rule out neoplastic process -GI following for possible upper endoscopy, may be able to biopsy mass via ultrasound - If unsuccessful would consider IR consult for liver biopsy - MRI - multiple hypoenhancing lesions at the tail of the pancreas at 2.5 cm, ill-defined and lymph nodes also noted at the hepatoduodenal ligament, subcapsular lesion in the right liver as well as T10 ring - enhancing lesion.    Portal vein thrombosis - Will not start anticoagulation until GI bleeding can be ruled out by GI.  -Likely hypercoagulable state given above findings concerning for metastatic disease   Melena likely secondary to NSAID induced gastric ulcer, POA -Increased naproxen and ibuprofen use over the past 2 weeks likely the main culprit, following endoscopy as above -Follow repeat labs, hemoglobin currently stable   Essential hypertension Continue Monopril and metoprolol.  Monitor blood pressure   Hypothyroidism Continue Cytomel.   DVT prophylaxis:  SCDs for DVT prophylaxis. No anticoagulation given until GI rules out GI bleeding  Code Status: Full Code  Family  Communication: None at bedside  Status is: Inpatient  Dispo: The patient is from: Home              Anticipated d/c is to: Home              Anticipated d/c date is: 48 to 72 hours              Patient currently not medically stable for discharge  Consultants:  GI  Procedures:  Upper endoscopy pending  Antimicrobials:  None indicated  Subjective: No acute issues or events overnight denies nausea vomiting diarrhea constipation headache fevers chills or chest pain.  Stool continues to be black but without overt maroon or bright red blood noted  Objective: Vitals:   01/28/21 0100 01/28/21 0215 01/28/21 0609 01/28/21 0615  BP: (!) 142/74 140/69 (!) 145/67 (!) 145/97  Pulse: (!) 105 93  93  Resp: 18 15 12 14   Temp:      TempSrc:      SpO2: 99% 97%  97%  Weight:      Height:        Intake/Output Summary (Last 24 hours) at 01/28/2021 0823 Last data filed at 01/28/2021 0414 Gross per 24 hour  Intake 427.47 ml  Output --  Net 427.47 ml   Filed Weights   01/27/21 1822  Weight: 77.1 kg    Examination:  General exam: Appears calm and comfortable  Respiratory system: Clear to auscultation. Respiratory effort normal. Cardiovascular system: S1 & S2 heard, RRR. No JVD, murmurs, rubs, gallops or clicks. No pedal edema. Gastrointestinal system: Abdomen is nondistended, soft and nontender.  No organomegaly or masses felt. Normal bowel sounds heard. Central nervous system: Alert and oriented. No focal neurological deficits. Extremities: Symmetric 5 x 5 power. Skin: No rashes, lesions or ulcers Psychiatry: Judgement and insight appear normal. Mood & affect appropriate.     Data Reviewed: I have personally reviewed following labs and imaging studies  CBC: Recent Labs  Lab 01/27/21 1843 01/28/21 0608  WBC 10.7* 9.5  NEUTROABS 7.4  --   HGB 12.7 11.7*  HCT 37.9 36.0  MCV 80.6 82.0  PLT 229 295   Basic Metabolic Panel: Recent Labs  Lab 01/27/21 1843 01/28/21 0608  NA  135 136  K 4.3 4.1  CL 100 105  CO2 27 25  GLUCOSE 122* 123*  BUN 25* 23  CREATININE 0.77 0.64  CALCIUM 9.4 8.6*   GFR: Estimated Creatinine Clearance: 65.5 mL/min (by C-G formula based on SCr of 0.64 mg/dL). Liver Function Tests: Recent Labs  Lab 01/27/21 1843 01/28/21 0608  AST 20 19  ALT 24 22  ALKPHOS 84 76  BILITOT 0.6 0.5  PROT 6.8 6.4*  ALBUMIN 3.8 3.4*   Recent Labs  Lab 01/27/21 1843  LIPASE 21   No results for input(s): AMMONIA in the last 168 hours. Coagulation Profile: Recent Labs  Lab 01/28/21 0130  INR 1.0   Cardiac Enzymes: No results for input(s): CKTOTAL, CKMB, CKMBINDEX, TROPONINI in the last 168 hours. BNP (last 3 results) No results for input(s): PROBNP in the last 8760 hours. HbA1C: No results for input(s): HGBA1C in the last 72 hours. CBG: No results for input(s): GLUCAP in the last 168 hours. Lipid Profile: No results for input(s): CHOL, HDL, LDLCALC, TRIG, CHOLHDL, LDLDIRECT in the last 72 hours. Thyroid Function Tests: No results for input(s): TSH, T4TOTAL, FREET4, T3FREE, THYROIDAB in the last 72 hours. Anemia Panel: No results for input(s): VITAMINB12, FOLATE, FERRITIN, TIBC, IRON, RETICCTPCT in the last 72 hours. Sepsis Labs: No results for input(s): PROCALCITON, LATICACIDVEN in the last 168 hours.  Recent Results (from the past 240 hour(s))  Resp Panel by RT-PCR (Flu A&B, Covid) Nasopharyngeal Swab     Status: None   Collection Time: 01/28/21  3:15 AM   Specimen: Nasopharyngeal Swab; Nasopharyngeal(NP) swabs in vial transport medium  Result Value Ref Range Status   SARS Coronavirus 2 by RT PCR NEGATIVE NEGATIVE Final    Comment: (NOTE) SARS-CoV-2 target nucleic acids are NOT DETECTED.  The SARS-CoV-2 RNA is generally detectable in upper respiratory specimens during the acute phase of infection. The lowest concentration of SARS-CoV-2 viral copies this assay can detect is 138 copies/mL. A negative result does not preclude  SARS-Cov-2 infection and should not be used as the sole basis for treatment or other patient management decisions. A negative result may occur with  improper specimen collection/handling, submission of specimen other than nasopharyngeal swab, presence of viral mutation(s) within the areas targeted by this assay, and inadequate number of viral copies(<138 copies/mL). A negative result must be combined with clinical observations, patient history, and epidemiological information. The expected result is Negative.  Fact Sheet for Patients:  EntrepreneurPulse.com.au  Fact Sheet for Healthcare Providers:  IncredibleEmployment.be  This test is no t yet approved or cleared by the [place] FDA and  has been authorized for detection and/or diagnosis of SARS-CoV-2 by FDA under an Emergency Use Authorization (EUA). This EUA will remain  in effect (meaning this test can be used) for the duration of the COVID-19 declaration under Section 564(b)(1) of the Act, 21 U.S.C.section  360bbb-3(b)(1), unless the authorization is terminated  or revoked sooner.       Influenza A by PCR NEGATIVE NEGATIVE Final   Influenza B by PCR NEGATIVE NEGATIVE Final    Comment: (NOTE) The Xpert Xpress SARS-CoV-2/FLU/RSV plus assay is intended as an aid in the diagnosis of influenza from Nasopharyngeal swab specimens and should not be used as a sole basis for treatment. Nasal washings and aspirates are unacceptable for Xpert Xpress SARS-CoV-2/FLU/RSV testing.  Fact Sheet for Patients: EntrepreneurPulse.com.au  Fact Sheet for Healthcare Providers: IncredibleEmployment.be  This test is not yet approved or cleared by the [place] FDA and has been authorized for detection and/or diagnosis of SARS-CoV-2 by FDA under an Emergency Use Authorization (EUA). This EUA will remain in effect (meaning this test can be used) for the duration of  the COVID-19 declaration under Section 564(b)(1) of the Act, 21 U.S.C. section 360bbb-3(b)(1), unless the authorization is terminated or revoked.  Performed at [facility], [place] [address], [place], [place] [address]          Radiology Studies: CT Abdomen Pelvis W Contrast  Result Date: 01/27/2021 CLINICAL DATA:  Abdominal pain. EXAM: CT ABDOMEN AND PELVIS WITH CONTRAST TECHNIQUE: Multidetector CT imaging of the abdomen and pelvis was performed using the standard protocol following bolus administration of intravenous contrast. RADIATION DOSE REDUCTION: This exam was performed according to the departmental dose-optimization program which includes automated exposure control, adjustment of the mA and/or kV according to patient size and/or use of iterative reconstruction technique. CONTRAST:  137mL OMNIPAQUE IOHEXOL 300 MG/ML  SOLN COMPARISON:  None. FINDINGS: Lower chest: No acute abnormality. Hepatobiliary: No focal liver abnormality is seen. A 1.7 cm x 1.7 cm x 1.9 cm low-attenuation soft tissue mass is seen within the periportal region (approximately 32.62 Hounsfield units). Mass effect on the portal vein is noted with subsequent marked severity portal vein narrowing (sagittal reformatted images 74 through 78, CT series 5). No gallstones, gallbladder wall thickening, or biliary dilatation. Pancreas: Adjacent 2.6 cm x 1.8 cm x 2.2 cm and 2.8 cm x 1.4 cm x 1.2 cm low-attenuation pancreatic masses are seen within the pancreatic tail (approximately 32.19 Hounsfield units). There is no evidence of pancreatic ductal dilatation. Spleen: Normal in size without focal abnormality. Adrenals/Urinary Tract: Adrenal glands are unremarkable. Kidneys are normal, without renal calculi, focal lesion, or hydronephrosis. Bladder is unremarkable. Stomach/Bowel: Stomach is within normal limits. Appendix appears normal. A large amount of stool is seen throughout the large bowel. This is most prominent  within the sigmoid colon. No evidence of bowel wall thickening, distention, or inflammatory changes. Vascular/Lymphatic: Aortic atherosclerosis. Extensive intraluminal filling defects are seen within the splenic vein. No enlarged abdominal or pelvic lymph nodes. Reproductive: Uterus and bilateral adnexa are unremarkable. Other: No abdominal wall hernia or abnormality. No abdominopelvic ascites. Musculoskeletal: Marked severity degenerative changes are seen at the level of L5-S1. IMPRESSION: 1. Marked amount of splenic vein thrombus. 2. Pancreatic masses, as described above, worrisome for an underlying pancreatic neoplasm. MRI correlation is recommended. 3. Low attenuation periportal lesion concerning for metastatic disease with subsequent marked severity narrowing of the portal vein. 4. Aortic atherosclerosis. 5. Marked severity degenerative changes at the level of L5-S1. Aortic Atherosclerosis (ICD10-I70.0). Electronically Signed   By: Virgina Norfolk M.D.   On: 01/27/2021 20:22    Scheduled Meds:  liothyronine  25 mcg Oral Daily   pantoprazole (PROTONIX) IV  40 mg Intravenous Q24H   Continuous Infusions:  lactated ringers 100 mL/hr at  01/28/21 0435     LOS: 0 days   Time spent: 45min  Raylynn Hersh C Adair Lemar, DO [facility]  If 7PM-7AM, please contact night-coverage [email or web address]  01/28/2021, 8:23 AM

## 2021-01-28 NOTE — Progress Notes (Signed)
ANTICOAGULATION CONSULT NOTE - Initial Consult  Pharmacy Consult for Heparin Indication: Splenic Vein Thrombus  Allergies  Allergen Reactions   Tramadol Hcl Other (See Comments)   Trazodone Hcl Other (See Comments)   Penicillins Rash and Other (See Comments)    Rash and hot all over. Has tolerated Keflex    Patient Measurements: Height: 5' 2.5" (158.8 cm) Weight: 77.1 kg (170 lb) IBW/kg (Calculated) : 51.25 Heparin Dosing Weight: 68 kg  Vital Signs: Temp: 98.7 F (37.1 C) (01/16 1818) Temp Source: Oral (01/16 1818) BP: 139/83 (01/17 0000) Pulse Rate: 100 (01/17 0000)  Labs: Recent Labs    01/27/21 1843  HGB 12.7  HCT 37.9  PLT 229  CREATININE 0.77    Estimated Creatinine Clearance: 65.5 mL/min (by C-G formula based on SCr of 0.77 mg/dL).   Medical History: Past Medical History:  Diagnosis Date   Anxiety    Heart murmur    no prob - no med   Hypertension    Hypothyroidism    Insomnia     Medications:  No oral anticoagulation PTA  Assessment: 71 yr female with abdominal pain s/p abdominal surgery October 2022 CT of abdomen/pelvis = + splenic vein thrombus as well as pancreatic masses Noted patient had reported black stools.  Hemoccult has been ordered. Per MD start heparin without a bolus  Goal of Therapy:  Heparin level 0.3-0.7 units/ml Monitor platelets by anticoagulation protocol: Yes   Plan:  Obtain baseline aPTT and PT/INR No heparin bolus per MD and start heparin gtt @ 1100 units/hr Check heparin level 6 hr after heparin started Daily heparin level & CBC  Evrett Hakim, Toribio Harbour, PharmD 01/28/2021,12:58 AM

## 2021-01-28 NOTE — Consult Note (Addendum)
Referring Provider: [facility] Primary Care Physician:  Lawerance Cruel, MD Primary Gastroenterologist:  Althia Forts  Reason for Consultation:  Melena  HPI: Gloria Ward is a 71 y.o. female medical history significant for HTN, hypothyroidism presents for melena and heme positive stool.  Patient originally presented to ED with 2 week history of intermittent generalized  abdominal pain that has progressively worsened.  Patient states over the last couple of days pain is gradually gotten worse.  Associated with nausea but no vomiting.  Patient reports chronic back pain that flares up during worsening episodes of abdominal pain.  She also reports sciatic pain that has been bothering her.  She states 1/16 she noticed black tarry stools which prompted her to come to the ED. Naproxen use TID for the last 2 weeks and ibuprofen once daily for her back pain.  She states she had a colonoscopy done about 20 years ago, unsure the results.  Unsure where she got it done.  Reports family history of colon cancer in her mother, unknown diagnosis age.  Reports previous history of ulcer 30 to 35 years ago.  Past Medical History:  Diagnosis Date   Anxiety    Heart murmur    no prob - no med   Hypertension    Hypothyroidism    Insomnia     Past Surgical History:  Procedure Laterality Date   AUGMENTATION MAMMAPLASTY     CESAREAN SECTION  1986, 1989   COSMETIC SURGERY     face lift   dental implants     DILATATION & CURRETTAGE/HYSTEROSCOPY WITH RESECTOCOPE  11/30/2011   Procedure: DILATATION & CURETTAGE/HYSTEROSCOPY WITH RESECTOCOPE;  Surgeon: Peri Maris, MD;  Location: [facility];  Service: Gynecology;  Laterality: N/A;   EYE SURGERY     right eye    Prior to Admission medications   Medication Sig Start Date End Date Taking? Authorizing Provider  dextroamphetamine (DEXTROSTAT) 10 MG tablet 2.5 tablets in the morning 04/04/20   [provider]  EPINEPHrine 0.3 mg/0.3 mL IJ SOAJ injection See admin  instructions. 12/09/16   [provider]  fosinopril (MONOPRIL) 40 MG tablet Take 40 mg by mouth daily. 06/24/20   [provider]  liothyronine (CYTOMEL) 25 MCG tablet 1 TABLET ON AN EMPTY STOMACH ORALLY TWICE A DAY 90 DAYS-NON PHARMACY    [provider]  LORazepam (ATIVAN) 2 MG tablet 2 tablets at bedtime as needed 05/06/20   [provider]  Melatonin 10 MG TABS 1 tablet at bedtime as needed with food 12/09/16   [provider]  metoprolol succinate (TOPROL-XL) 100 MG 24 hr tablet 1 tablet    [provider]  PRESCRIPTION MEDICATION Estradiol 6 mg 1 tablet daily    [provider]    Scheduled Meds:  liothyronine  25 mcg Oral Daily   pantoprazole (PROTONIX) IV  40 mg Intravenous Q24H   Continuous Infusions:  lactated ringers 100 mL/hr at 01/28/21 0435   PRN Meds:.acetaminophen **OR** acetaminophen, HYDROmorphone (DILAUDID) injection, Melatonin, ondansetron **OR** ondansetron (ZOFRAN) IV, senna-docusate  Allergies as of 01/27/2021 - Review Complete 01/27/2021  Allergen Reaction Noted   Tramadol hcl Other (See Comments) 06/01/2020   Trazodone hcl Other (See Comments) 06/01/2020   Penicillins Rash and Other (See Comments) 11/17/2011    History reviewed. No pertinent family history.  Social History   Socioeconomic History   Marital status: Single    Spouse name: Not on file   Number of children: Not on file   Years  of education: Not on file   Highest education level: Not on file  Occupational History   Not on file  Tobacco Use   Smoking status: Never   Smokeless tobacco: Never  Vaping Use   Vaping Use: Never used  Substance and Sexual Activity   Alcohol use: Yes    Comment: socially   Drug use: No   Sexual activity: Yes    Birth control/protection: Post-menopausal  Other Topics Concern   Not on file  Social History Narrative   Not on file   Social Determinants of Health   Financial Resource Strain:  Not on file  Food Insecurity: Not on file  Transportation Needs: Not on file  Physical Activity: Not on file  Stress: Not on file  Social Connections: Not on file  Intimate Partner Violence: Not on file    Review of Systems: Review of Systems  Constitutional:  Negative for chills and fever.  HENT:  Negative for hearing loss and tinnitus.   Eyes:  Negative for blurred vision and double vision.  Respiratory:  Negative for cough and hemoptysis.   Cardiovascular:  Negative for chest pain and palpitations.  Gastrointestinal:  Positive for abdominal pain, melena and nausea. Negative for blood in stool, constipation, diarrhea, heartburn and vomiting.  Genitourinary:  Negative for dysuria and urgency.  Musculoskeletal:  Negative for myalgias and neck pain.  Skin:  Negative for itching and rash.  Neurological:  Negative for seizures and loss of consciousness.  Psychiatric/Behavioral:  Negative for substance abuse. The patient is not nervous/anxious.     Physical Exam:Physical Exam Constitutional:      Appearance: Normal appearance.  HENT:     Head: Normocephalic and atraumatic.     Nose: Nose normal. No congestion.     Mouth/Throat:     Mouth: Mucous membranes are moist.     Pharynx: Oropharynx is clear.  Eyes:     Extraocular Movements: Extraocular movements intact.     Conjunctiva/sclera: Conjunctivae normal.  Cardiovascular:     Rate and Rhythm: Normal rate and regular rhythm.  Pulmonary:     Effort: Pulmonary effort is normal. No respiratory distress.  Abdominal:     General: Abdomen is flat. Bowel sounds are normal. There is no distension.     Palpations: There is no mass.     Tenderness: There is no abdominal tenderness. There is no guarding or rebound.     Hernia: No hernia is present.  Musculoskeletal:        General: No swelling. Normal range of motion.     Cervical back: Normal range of motion and neck supple.  Skin:    General: Skin is warm and dry.  Neurological:      General: No focal deficit present.     Mental Status: She is alert and oriented to person, place, and time.  Psychiatric:        Mood and Affect: Mood normal.        Behavior: Behavior normal.        Thought Content: Thought content normal.        Judgment: Judgment normal.    Vital signs: Vitals:   01/28/21 0609 01/28/21 0615  BP: (!) 145/67 (!) 145/97  Pulse:  93  Resp: 12 14  Temp:    SpO2:  97%        GI:  Lab Results: Recent Labs    01/27/21 1843 01/28/21 0608  WBC 10.7* 9.5  HGB 12.7 11.7*  HCT 37.9 36.0  PLT 229 209   BMET Recent Labs    01/27/21 1843 01/28/21 0608  NA 135 136  K 4.3 4.1  CL 100 105  CO2 27 25  GLUCOSE 122* 123*  BUN 25* 23  CREATININE 0.77 0.64  CALCIUM 9.4 8.6*   LFT Recent Labs    01/28/21 0608  PROT 6.4*  ALBUMIN 3.4*  AST 19  ALT 22  ALKPHOS 76  BILITOT 0.5   PT/INR Recent Labs    01/28/21 0130  LABPROT 13.2  INR 1.0     Studies/Results: CT Abdomen Pelvis W Contrast  Result Date: 01/27/2021 CLINICAL DATA:  Abdominal pain. EXAM: CT ABDOMEN AND PELVIS WITH CONTRAST TECHNIQUE: Multidetector CT imaging of the abdomen and pelvis was performed using the standard protocol following bolus administration of intravenous contrast. RADIATION DOSE REDUCTION: This exam was performed according to the departmental dose-optimization program which includes automated exposure control, adjustment of the mA and/or kV according to patient size and/or use of iterative reconstruction technique. CONTRAST:  128m OMNIPAQUE IOHEXOL 300 MG/ML  SOLN COMPARISON:  None. FINDINGS: Lower chest: No acute abnormality. Hepatobiliary: No focal liver abnormality is seen. A 1.7 cm x 1.7 cm x 1.9 cm low-attenuation soft tissue mass is seen within the periportal region (approximately 32.62 Hounsfield units). Mass effect on the portal vein is noted with subsequent marked severity portal vein narrowing (sagittal reformatted images 74 through 78, CT series 5).  No gallstones, gallbladder wall thickening, or biliary dilatation. Pancreas: Adjacent 2.6 cm x 1.8 cm x 2.2 cm and 2.8 cm x 1.4 cm x 1.2 cm low-attenuation pancreatic masses are seen within the pancreatic tail (approximately 32.19 Hounsfield units). There is no evidence of pancreatic ductal dilatation. Spleen: Normal in size without focal abnormality. Adrenals/Urinary Tract: Adrenal glands are unremarkable. Kidneys are normal, without renal calculi, focal lesion, or hydronephrosis. Bladder is unremarkable. Stomach/Bowel: Stomach is within normal limits. Appendix appears normal. A large amount of stool is seen throughout the large bowel. This is most prominent within the sigmoid colon. No evidence of bowel wall thickening, distention, or inflammatory changes. Vascular/Lymphatic: Aortic atherosclerosis. Extensive intraluminal filling defects are seen within the splenic vein. No enlarged abdominal or pelvic lymph nodes. Reproductive: Uterus and bilateral adnexa are unremarkable. Other: No abdominal wall hernia or abnormality. No abdominopelvic ascites. Musculoskeletal: Marked severity degenerative changes are seen at the level of L5-S1. IMPRESSION: 1. Marked amount of splenic vein thrombus. 2. Pancreatic masses, as described above, worrisome for an underlying pancreatic neoplasm. MRI correlation is recommended. 3. Low attenuation periportal lesion concerning for metastatic disease with subsequent marked severity narrowing of the portal vein. 4. Aortic atherosclerosis. 5. Marked severity degenerative changes at the level of L5-S1. Aortic Atherosclerosis (ICD10-I70.0). Electronically Signed   By: TVirgina NorfolkM.D.   On: 01/27/2021 20:22    Impression: Melena, abdominal pain - hgb 11.7, MCV 82.0 - BUN 23, Cr. 0.64 - AST 19/ ALT 22/ Alk phos 76, T. Bili 0.5 - CT  ab/pelvis with contrast 1/16: marked splenic vein thrombus, pancreatic masses worrisome for neoplasm, periportal lesion concerning for metastatic  disease with narrowing of portal vein.  Portal Vein thrombosis  Essential hypertension  Plan: Continue with plan for MRI for further evaluation of findings on recent CT Will await results of MRI and then decide how to proceed. Discussed possibility of EGD. Will obtain CA 19-9. Continue supportive care [facility] will follow       LOS: 0 days   Shaneal Barasch MRadford Pax PA-C 01/28/2021, 7:48  AM  Contact #  [phone]

## 2021-01-28 NOTE — H&P (Signed)
History and Physical    Gloria Ward KKX:[id number] DOB: 22-Feb-1950 DOA: 01/27/2021  PCP: Lawerance Cruel, MD   Patient coming from: Home  Chief Complaint: abdominal pain  HPI: Gloria Ward is a 71 y.o. female with medical history significant for HTN, hypothyroidism presents for evaluation of severe abdominal pain.  She reports she been having abdominal pain for the last 2 weeks intermittently.  Pain is gradually gotten worse and for the last 5 days has been more persistent and severe.  She reports pain at its worst is a 9 out of 10 and has been associate with nausea but no vomiting.  She then developed some pain in her right lower back.  She does have chronic back pain but it was worse yesterday and in conjunction with her abdominal pain.  She states that yesterday afternoon she had black tarry stools so she decided to come for evaluation as the pain was getting worse.  She is not on any anticoagulation.  She had abdominal Placey 4 months ago and at first she thought the pain was secondary to the surgery but as it is gotten worse she does not believe it is related.  She does states she had an ulcer 30 to 35 years ago.  She reports for the last 2 weeks she has been taking Naprosyn 3 times a day and ibuprofen once a day for her back pain.  She denies any urinary frequency, dysuria urinary incontinence or hesitancy.  She does have a history of anal fissures in the past but no recent rectal pain or red blood with bowel movements.  She works as a Armed forces operational officer for [employer].  Drinks alcohol socially.  Denies tobacco or illicit drug use  ED Course: Gloria Ward has been hemodynamically stable in the emergency room.  CT scan shows masses in the pancreas tail concerning for underlying malignancy.  There is also soft tissue in the periportal region of her liver concerning for metastasis.  CT also reports portal vein thrombus. Urinalysis shows trace leukocytes but patient has no symptoms of  urinary infection making this an asymptomatic bacteriuria.  Stool was Hemoccult positive.  Lab work reveals sodium 135 potassium 4.3 chloride 100 bicarb 27 creatinine 0.77 BUN 25 glucose 122 calcium 9.4 alkaline phosphatase 84 AST 20 ALT 24 lipase 21 albumin 3.8 total bilirubin 0.6, WBC 10,700 hemoglobin 12.7 hematocrit 37.9 platelets 229,000, INR 1.0.  COVID-negative.  Influenza A and B are negative.  She was started on IV Protonix in the emergency room.  Secure chat to gastroenterology was sent for consultation in the morning to rule out GI bleed.  Heparin was ordered by the ER physician but was discontinued patient potentially has an acute GI bleed and this will need to be ruled out before starting anticoagulation for the portal vein thrombus.  Hospitalist service asked to admit for further management  Review of Systems:  General: Denies fever, chills, weight loss, night sweats.  Denies dizziness. Denies change in appetite HENT: Denies head trauma, headache, denies tinnitus.  Denies nasal congestion or bleeding. Denies sore throat.  Denies difficulty swallowing Eyes: Denies blurry vision, pain in eye, drainage.  Denies discoloration of eyes. Neck: Denies pain.  Denies swelling.  Denies pain with movement. Cardiovascular: Denies chest pain, palpitations.  Denies edema.  Denies orthopnea Respiratory: Denies shortness of breath, cough.  Denies wheezing.  Denies sputum production Gastrointestinal: Reports abdominal pain, melena.  Denies nausea, vomiting, diarrhea. Denies hematemesis. Musculoskeletal: Denies limitation of movement.  Denies deformity or swelling. Denies arthralgias or myalgias. Genitourinary: Denies pelvic pain. Denies urinary frequency or hesitancy.  Denies dysuria.  Skin: Denies rash.  Denies petechiae, purpura, ecchymosis. Neurological: Denies syncope. Denies seizure activity. Denies slurred speech, drooping face.  Denies visual change. Psychiatric: Denies depression, anxiety. Denies  hallucinations.  Past Medical History:  Diagnosis Date   Anxiety    Heart murmur    no prob - no med   Hypertension    Hypothyroidism    Insomnia     Past Surgical History:  Procedure Laterality Date   AUGMENTATION MAMMAPLASTY     CESAREAN SECTION  1986, 1989   COSMETIC SURGERY     face lift   dental implants     DILATATION & CURRETTAGE/HYSTEROSCOPY WITH RESECTOCOPE  11/30/2011   Procedure: DILATATION & CURETTAGE/HYSTEROSCOPY WITH RESECTOCOPE;  Surgeon: Peri Maris, MD;  Location: [facility];  Service: Gynecology;  Laterality: N/A;   EYE SURGERY     right eye    Social History  reports that she has never smoked. She has never used smokeless tobacco. She reports current alcohol use. She reports that she does not use drugs.  Allergies  Allergen Reactions   Tramadol Hcl Other (See Comments)   Trazodone Hcl Other (See Comments)   Penicillins Rash and Other (See Comments)    Rash and hot all over. Has tolerated Keflex    History reviewed. No pertinent family history.   Prior to Admission medications   Medication Sig Start Date End Date Taking? Authorizing Provider  dextroamphetamine (DEXTROSTAT) 10 MG tablet 2.5 tablets in the morning 04/04/20   [provider]  EPINEPHrine 0.3 mg/0.3 mL IJ SOAJ injection See admin instructions. 12/09/16   [provider]  fosinopril (MONOPRIL) 40 MG tablet Take 40 mg by mouth daily. 06/24/20   [provider]  liothyronine (CYTOMEL) 25 MCG tablet 1 TABLET ON AN EMPTY STOMACH ORALLY TWICE A DAY 90 DAYS-NON PHARMACY    [provider]  LORazepam (ATIVAN) 2 MG tablet 2 tablets at bedtime as needed 05/06/20   [provider]  Melatonin 10 MG TABS 1 tablet at bedtime as needed with food 12/09/16   [provider]  metoprolol succinate (TOPROL-XL) 100 MG 24 hr tablet 1 tablet    [provider]  PRESCRIPTION MEDICATION Estradiol 6 mg 1 tablet daily    [provider]     Physical Exam: Vitals:   01/27/21 1822 01/28/21 0000 01/28/21 0100 01/28/21 0215  BP:  139/83 (!) 142/74 140/69  Pulse:  100 (!) 105 93  Resp:  18 18 15   Temp:      TempSrc:      SpO2:  99% 99% 97%  Weight: 77.1 kg     Height: 5' 2.5" (1.588 m)       Constitutional: NAD, calm, comfortable Vitals:   01/27/21 1822 01/28/21 0000 01/28/21 0100 01/28/21 0215  BP:  139/83 (!) 142/74 140/69  Pulse:  100 (!) 105 93  Resp:  18 18 15   Temp:      TempSrc:      SpO2:  99% 99% 97%  Weight: 77.1 kg     Height: 5' 2.5" (1.588 m)      General: WDWN, Alert and oriented x3.  Eyes: EOMI, PERRL, conjunctivae normal.  Sclera nonicteric HENT:  [place]/AT, external ears normal. Nares patent without epistasis. Mucous membranes are moist. Posterior pharynx clear of any exudate Neck: Soft, normal range of motion, supple, no masses, Trachea midline  Respiratory: clear to auscultation bilaterally, no wheezing, no crackles. Normal respiratory effort. No accessory muscle use.  Cardiovascular: Regular rate and rhythm, no murmurs / rubs / gallops. No extremity edema. 2+ pedal pulses.  Abdomen: Soft, diffuse lower abdominal tenderness, nondistended, no rebound or guarding.  No masses palpated. Bowel sounds normoactive Musculoskeletal: FROM. no cyanosis. No joint deformity upper and lower extremities. Normal muscle tone. Healing scars in lower abdomen with no surrounding erythema Skin: Warm, dry, intact no rashes, lesions, ulcers. No induration Neurologic: CN 2-12 grossly intact.  Normal speech.  Sensation intact, Strength 5/5 in all extremities.   Psychiatric: Normal judgment and insight. Normal mood.   Labs on Admission: I have personally reviewed following labs and imaging studies  CBC: Recent Labs  Lab 01/27/21 1843  WBC 10.7*  NEUTROABS 7.4  HGB 12.7  HCT 37.9  MCV 80.6  PLT 967    Basic Metabolic Panel: Recent Labs  Lab 01/27/21 1843  NA 135  K 4.3  CL 100  CO2 27  GLUCOSE 122*  BUN  25*  CREATININE 0.77  CALCIUM 9.4    GFR: Estimated Creatinine Clearance: 65.5 mL/min (by C-G formula based on SCr of 0.77 mg/dL).  Liver Function Tests: Recent Labs  Lab 01/27/21 1843  AST 20  ALT 24  ALKPHOS 84  BILITOT 0.6  PROT 6.8  ALBUMIN 3.8    Urine analysis:    Component Value Date/Time   COLORURINE YELLOW 01/28/2021 0100   APPEARANCEUR HAZY (A) 01/28/2021 0100   LABSPEC >1.046 (H) 01/28/2021 0100   PHURINE 7.0 01/28/2021 0100   GLUCOSEU NEGATIVE 01/28/2021 0100   HGBUR NEGATIVE 01/28/2021 0100   BILIRUBINUR NEGATIVE 01/28/2021 0100   KETONESUR 5 (A) 01/28/2021 0100   PROTEINUR NEGATIVE 01/28/2021 0100   NITRITE NEGATIVE 01/28/2021 0100   LEUKOCYTESUR TRACE (A) 01/28/2021 0100    Radiological Exams on Admission: CT Abdomen Pelvis W Contrast  Result Date: 01/27/2021 CLINICAL DATA:  Abdominal pain. EXAM: CT ABDOMEN AND PELVIS WITH CONTRAST TECHNIQUE: Multidetector CT imaging of the abdomen and pelvis was performed using the standard protocol following bolus administration of intravenous contrast. RADIATION DOSE REDUCTION: This exam was performed according to the departmental dose-optimization program which includes automated exposure control, adjustment of the mA and/or kV according to patient size and/or use of iterative reconstruction technique. CONTRAST:  152mL OMNIPAQUE IOHEXOL 300 MG/ML  SOLN COMPARISON:  None. FINDINGS: Lower chest: No acute abnormality. Hepatobiliary: No focal liver abnormality is seen. A 1.7 cm x 1.7 cm x 1.9 cm low-attenuation soft tissue mass is seen within the periportal region (approximately 32.62 Hounsfield units). Mass effect on the portal vein is noted with subsequent marked severity portal vein narrowing (sagittal reformatted images 74 through 78, CT series 5). No gallstones, gallbladder wall thickening, or biliary dilatation. Pancreas: Adjacent 2.6 cm x 1.8 cm x 2.2 cm and 2.8 cm x 1.4 cm x 1.2 cm low-attenuation pancreatic masses are  seen within the pancreatic tail (approximately 32.19 Hounsfield units). There is no evidence of pancreatic ductal dilatation. Spleen: Normal in size without focal abnormality. Adrenals/Urinary Tract: Adrenal glands are unremarkable. Kidneys are normal, without renal calculi, focal lesion, or hydronephrosis. Bladder is unremarkable. Stomach/Bowel: Stomach is within normal limits. Appendix appears normal. A large amount of stool is seen throughout the large bowel. This is most prominent within the sigmoid colon. No evidence of bowel wall thickening, distention, or inflammatory changes. Vascular/Lymphatic: Aortic atherosclerosis. Extensive intraluminal filling defects are seen within the splenic vein. No enlarged abdominal or  pelvic lymph nodes. Reproductive: Uterus and bilateral adnexa are unremarkable. Other: No abdominal wall hernia or abnormality. No abdominopelvic ascites. Musculoskeletal: Marked severity degenerative changes are seen at the level of L5-S1. IMPRESSION: 1. Marked amount of splenic vein thrombus. 2. Pancreatic masses, as described above, worrisome for an underlying pancreatic neoplasm. MRI correlation is recommended. 3. Low attenuation periportal lesion concerning for metastatic disease with subsequent marked severity narrowing of the portal vein. 4. Aortic atherosclerosis. 5. Marked severity degenerative changes at the level of L5-S1. Aortic Atherosclerosis (ICD10-I70.0). Electronically Signed   By: Virgina Norfolk M.D.   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  Presented with severe abdominal pain and is found to have Pancreatic masses in tail of pancreas.  MRI abdomen to better evaluate masses ordered. Concern is for neoplasm with metastasis with soft tissue mass in periportal region with narrowing of splenic vein.  Check CMP in am     Active Problems:   Abdominal pain Pt  has had severe abdominal pain for past 2 weeks intermittently but worse in past 5 days.  Has pancreatic masses and portal vein thrombus which could be contributing to pain.  Dilaudid for pain overnight.  GI consulted for evaluation in am.  Has hemoccult positive stool and may need endoscopy to further evaluate. Has hx of PUD 30 yrs ago.    Portal vein thrombosis Will not start anticoagulation until GI bleeding can be ruled out by GI.  Pt probably hypercoagulable with pancreatic neoplasm that is concerning for malignancy    Melena Patient reports black tarry stools yesterday prior to arrival at the emergency room.  Stools were Hemoccult positive.  She does have a history of peptic ulcer disease 30 to 35 years ago. She does report that she has been taking Naprosyn and ibuprofen daily for the last 2 weeks making PUD more likely Check CBC in am    Essential hypertension Continue Monopril and metoprolol.  Monitor blood pressure    Hypothyroidism Continue Cytomel.  DVT prophylaxis: SCDs for DVT prophylaxis. No anticoagulation given until GI rules out GI bleeding  Code Status:   Full Code  Family Communication:  Diagnosis and plan discussed with patient.  She verbalized understanding agrees with plan.  Further recommendations to follow as clinical indicated Disposition Plan:   Patient is from:  Home  Anticipated DC to:  Home  Anticipated DC date:  Anticipate 2 midnight or more stay in the hospital  Time Spent:   75 minutes  Consults called:  Gastroenterology consulted by secure chat by Er physician  Admission status:  Inpatient  Yevonne Aline Kaegan Hettich MD [facility]  How to contact the [facility] Attending or Consulting provider [place] or covering provider during after hours [place], for this patient?   Check the care team in [facility] and look for a) attending/consulting [facility] provider listed and b) the [facility] team listed Log into [email or web address] and use [place]'s universal password to access. If  you do not have the password, please contact the hospital operator. Locate the [facility] provider you are looking for under [facility] and page to a number that you can be directly reached. If you still have difficulty reaching the provider, please page the [facility] (Director on Call) for the Hospitalists listed on amion for assistance.  01/28/2021, 4:12 AM

## 2021-01-28 NOTE — ED Notes (Addendum)
Called lab to add aptt/INR to labs already drawn, blue top in lab

## 2021-01-28 NOTE — ED Notes (Signed)
Patient transported to MRI 

## 2021-01-28 NOTE — ED Notes (Signed)
Patient made aware that she needs to remain NPO at this time for MRI.  Understanding voiced

## 2021-01-29 ENCOUNTER — Encounter (HOSPITAL_COMMUNITY): Payer: Self-pay | Admitting: Family Medicine

## 2021-01-29 DIAGNOSIS — I81 Portal vein thrombosis: Secondary | ICD-10-CM | POA: Diagnosis not present

## 2021-01-29 DIAGNOSIS — E039 Hypothyroidism, unspecified: Secondary | ICD-10-CM

## 2021-01-29 DIAGNOSIS — K922 Gastrointestinal hemorrhage, unspecified: Secondary | ICD-10-CM

## 2021-01-29 DIAGNOSIS — D378 Neoplasm of uncertain behavior of other specified digestive organs: Secondary | ICD-10-CM

## 2021-01-29 LAB — CBC
HCT: 34.1 % — ABNORMAL LOW (ref 36.0–46.0)
Hemoglobin: 10.9 g/dL — ABNORMAL LOW (ref 12.0–15.0)
MCH: 26.3 pg (ref 26.0–34.0)
MCHC: 32 g/dL (ref 30.0–36.0)
MCV: 82.2 fL (ref 80.0–100.0)
Platelets: 183 10*3/uL (ref 150–400)
RBC: 4.15 MIL/uL (ref 3.87–5.11)
RDW: 13.2 % (ref 11.5–15.5)
WBC: 7.9 10*3/uL (ref 4.0–10.5)
nRBC: 0 % (ref 0.0–0.2)

## 2021-01-29 LAB — TSH: TSH: 11.809 u[IU]/mL — ABNORMAL HIGH (ref 0.350–4.500)

## 2021-01-29 LAB — T4, FREE: Free T4: <0.25 ng/dL — ABNORMAL LOW (ref 0.61–1.12)

## 2021-01-29 LAB — CANCER ANTIGEN 19-9: CA 19-9: 3609 U/mL — ABNORMAL HIGH (ref 0–35)

## 2021-01-29 MED ORDER — HYDROMORPHONE HCL 1 MG/ML IJ SOLN
0.5000 mg | INTRAMUSCULAR | Status: DC | PRN
  Administered 2021-01-29: 0.5 mg via INTRAVENOUS
  Filled 2021-01-29 (×2): qty 0.5

## 2021-01-29 MED ORDER — LIOTHYRONINE SODIUM 25 MCG PO TABS
12.5000 ug | ORAL_TABLET | Freq: Every day | ORAL | Status: DC
  Administered 2021-01-29: 12.5 ug via ORAL
  Filled 2021-01-29 (×2): qty 1

## 2021-01-29 MED ORDER — PANTOPRAZOLE SODIUM 40 MG IV SOLR
40.0000 mg | Freq: Two times a day (BID) | INTRAVENOUS | Status: DC
  Administered 2021-01-29 – 2021-02-07 (×18): 40 mg via INTRAVENOUS
  Filled 2021-01-29 (×19): qty 40

## 2021-01-29 MED ORDER — LORAZEPAM 1 MG PO TABS: 2.0000 mg | ORAL_TABLET | Freq: Two times a day (BID) | ORAL | Status: DC | PRN

## 2021-01-29 MED ORDER — HYDROMORPHONE HCL 1 MG/ML IJ SOLN
1.0000 mg | INTRAMUSCULAR | Status: DC | PRN
  Administered 2021-01-29 – 2021-01-30 (×11): 1 mg via INTRAVENOUS
  Filled 2021-01-29 (×12): qty 1

## 2021-01-29 MED ORDER — HYDRALAZINE HCL 25 MG PO TABS: 25.0000 mg | ORAL_TABLET | ORAL | Status: DC | PRN

## 2021-01-29 MED ORDER — LABETALOL HCL 5 MG/ML IV SOLN
10.0000 mg | INTRAVENOUS | Status: DC | PRN
  Administered 2021-02-04: 08:00:00 10 mg via INTRAVENOUS
  Filled 2021-01-29 (×2): qty 4

## 2021-01-29 MED ORDER — HYDROMORPHONE HCL 1 MG/ML IJ SOLN
0.5000 mg | INTRAMUSCULAR | Status: AC | PRN
  Administered 2021-01-29 (×2): 0.5 mg via INTRAVENOUS
  Filled 2021-01-29 (×2): qty 0.5

## 2021-01-29 MED ORDER — HEPARIN (PORCINE) 25000 UT/250ML-% IV SOLN
1250.0000 [IU]/h | INTRAVENOUS | Status: DC
  Administered 2021-01-29: 16:00:00 1100 [IU]/h via INTRAVENOUS
  Filled 2021-01-29 (×2): qty 250

## 2021-01-29 NOTE — Assessment & Plan Note (Addendum)
-   Patient endorses history of Hashimoto's thyroiditis, managed by her primary care outpatient.  Unable to review any labs - Patient reports self adjustment of Cytomel at home.  States that she has not tolerated T4 replacement in the past -Discussed further with patient today.  Thyroid studies have returned and I reviewed them with her as well as discussed with endocrinology -At this time we will start low-dose levothyroxine and continue Cytomel at lower doses.  She will need repeat thyroid studies outpatient in 4 to 6 weeks depending on prognosis/clinical course - given NPO status, start on IV synthroid in the meantime

## 2021-01-29 NOTE — Assessment & Plan Note (Addendum)
-   Patient presented with acute onset of abdominal pain.  Multiple possible etiologies including underlying lesion involving the tail of pancreas; patient also has been on daily use of NSAIDs with melanotic stools with risk for ulceration/GIB -Palliative care also consulted on admission for assistance with pain control and possible further GOC discussions pending underlying malignancy work-up -Some worsening abdominal distention and pain on 01/31/2021.  She is still having bowel movements.  X-ray obtained and did not show any acute abnormalities (stool burden).  She does have potential for progression of underlying disease process - worsening pain/distension with episode of hematemesis on 1/22; abd xray repeated followed by CT A/P (no overt findings of SBO or ileus, but patient still higher risk)

## 2021-01-29 NOTE — Progress Notes (Signed)
° ° °  OVERNIGHT PROGRESS REPORT  Notified by RN for Patient stating that she cannot take Ultram due to allergy. Listed on her chart at intake.      Gershon Cull MSNA MSN [facility] Acute Care Nurse Practitioner [place]

## 2021-01-29 NOTE — Assessment & Plan Note (Addendum)
-   risk for NSAID induced erosions/ulcerations; patient endorses using Aleve at home for approximately 4 weeks; melanotic stools reported on admission  -GI consulted on admission; currently monitoring  - continue PPI for now  - multiple episodes of hematemesis on 1/22. Discontinued heparin drip due to this; still melanotic stools; STAT H/H showed Hgb down to 7.6 g/dL post hematemesis; treated with 1 unit PRBC - continue trending CBC, she continues to have melanotic stools; likely unable to undergo GI intervention at this point, thus continue transfusing as needed/indicated

## 2021-01-29 NOTE — Progress Notes (Signed)
Progress Note    Gloria Ward   GYJ:[id number]  DOB: 1950/04/01  DOA: 01/27/2021     1 PCP: Lawerance Cruel, MD  Initial CC: abdominal and back pain, dark stools  Hospital Course: Gloria Ward is a 71 yo female with PMH anxiety, HTN, Hashimoto's thyroiditis who presented to the hospital with approximately 2-week complaint of intermittent abdominal pain also radiating to her back.  She was also having black tarry stools at home.  She endorsed taking Aleve at essentially max dose for approximately 4 weeks prior to admission and was also using Norco for pain control. CT abdomen/pelvis on admission showed a splenic vein thrombus and pancreatic mass with recommendation for MRI to further correlate. 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 She was admitted for GI evaluation and further work-up regarding underlying lesions.  Interval History:  Seen this morning with daughter present bedside.  Reviewed findings in detail from her MRI.  Discussed next steps including liver biopsy as well as continuing pain control and adjusting as needed.  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  Patient wishing for biopsy results prior to further discussion with oncology  Abdominal pain - Patient presents with acute onset of abdominal pain.  Multiple possible etiologies including underlying lesion involving the tail of pancreas; patient also has been on  daily use of NSAIDs with melanotic stools with risk for ulceration/GIB - Continue pain regimen and modifying as needed.  Today pain remained uncontrolled and we discussed further changes to her regimen -Palliative care also consulted on admission for assistance with pain control and possible further GOC discussions pending underlying malignancy work-up  GIB (gastrointestinal bleeding) - risk for NSAID induced erosions/ulcerations; patient endorses using Aleve at home for approximately 4 weeks; melanotic stools reported on admission  -GI consulted on admission, work-up ongoing currently -Continue trending H/H and will transfuse if further drops but so far remaining stable - continue PPI for now   Hypothyroidism - Patient endorses history of Hashimoto's thyroiditis, managed by her primary care outpatient.  Unable to review any labs - Patient reports self adjustment of Cytomel at home.  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  Likely related to underlying malignancy work-up - Continue heparin drip and will need to be transitioned to oral anticoagulation prior to discharge; will discuss with oncology at that time regarding preference -If bleeding or hemoglobin drop, will need to discontinue heparin drip    Old records reviewed in assessment of this patient  Antimicrobials:   DVT prophylaxis: Heparin drip  Code Status:   Code Status: Full Code  Disposition Plan: Home Status is: Inpatient  Objective: Blood pressure (!) 158/92, pulse 88, temperature 98 F (36.7 C), temperature source Oral, resp. rate 18, height 5' 2.5" (  1.588 m), weight 75.7 kg, last menstrual period 06/30/2011, SpO2 98 %.  Examination:  Physical  Exam Constitutional:      Appearance: Normal appearance.  HENT:     Head: Normocephalic and atraumatic.     Mouth/Throat:     Mouth: Mucous membranes are moist.  Eyes:     Extraocular Movements: Extraocular movements intact.  Cardiovascular:     Rate and Rhythm: Normal rate and regular rhythm.  Pulmonary:     Effort: Pulmonary effort is normal.     Breath sounds: Normal breath sounds.  Abdominal:     General: Bowel sounds are normal. There is no distension.     Palpations: Abdomen is soft.     Tenderness: There is abdominal tenderness.  Musculoskeletal:        General: Normal range of motion.     Cervical back: Normal range of motion and neck supple.  Skin:    General: Skin is warm and dry.  Neurological:     General: No focal deficit present.     Mental Status: She is alert.  Psychiatric:        Mood and Affect: Mood normal.        Behavior: Behavior normal.     Consultants:  GI Palliative care Oncology IR  Procedures:    Data Reviewed: I have personally reviewed labs and imaging studies    LOS: 1 day  Time spent: Greater than 50% of the 35 minute visit was spent in counseling/coordination of care for the patient as laid out in the A&P.   Dwyane Dee, MD [facility] 01/29/2021, 7:20 PM

## 2021-01-29 NOTE — Assessment & Plan Note (Addendum)
-   BP more controlled  - Use labetalol or hydralazine as needed

## 2021-01-29 NOTE — H&P (Signed)
Chief Complaint: Liver lesion  Referring Physician(s): Minda Ditto, PA-C  Supervising Physician: Jacqulynn Cadet  Patient Status: [facility] - In-pt  History of Present Illness: Gloria Ward is a 71 y.o. female with HTN and hypothyroidism who presented to the ED c/o abdominal pain, melena and heme positive stool.   She reports nausea but no vomiting.    CT done 01/27/21 showed= 1. Marked amount of splenic vein thrombus. 2. Pancreatic masses, as described above, worrisome for an underlying pancreatic neoplasm. MRI correlation is recommended. 3. Low attenuation periportal lesion concerning for metastatic disease with subsequent marked severity narrowing of the portal vein.  MR done yesterday showed= 1. 2.5 x 2.1 cm hypoenhancing lesion in the tail of pancreas with peripheral enhancement. Imaging features highly concerning for pancreatic adenocarcinoma. Endoscopic ultrasound would likely prove helpful to further evaluate. 2. Ill-defined lymph nodes in the hepatoduodenal ligament and just to the right of the celiac axis. This lymphadenopathy is associated with marked attenuation of the main portal vein just distal to the portal splenic confluence, presumably secondary to mass-effect/vascular involvement by the lymphadenopathy 3. 1.7 cm subcapsular lesion identified inferior right liver with irregular rim enhancement. Imaging features highly concerning for metastatic disease. 4. 1.4 cm rim enhancing lesion in the lower thoracic spine at approximately T10. Metastatic disease a concern.  We are asked to evaluate for biopsy.  Past Medical History:  Diagnosis Date   Anxiety    Heart murmur    no prob - no med   Hypertension    Hypothyroidism    Insomnia     Past Surgical History:  Procedure Laterality Date   AUGMENTATION MAMMAPLASTY     CESAREAN SECTION  1986, 1989   COSMETIC SURGERY     face lift   dental implants     DILATATION & CURRETTAGE/HYSTEROSCOPY  WITH RESECTOCOPE  11/30/2011   Procedure: DILATATION & CURETTAGE/HYSTEROSCOPY WITH RESECTOCOPE;  Surgeon: Peri Maris, MD;  Location: [facility];  Service: Gynecology;  Laterality: N/A;   EYE SURGERY     right eye    Allergies: Amlodipine, Tramadol hcl, Trazodone hcl, and Penicillins  Medications: Prior to Admission medications   Medication Sig Start Date End Date Taking? Authorizing Provider  amphetamine-dextroamphetamine (ADDERALL) 12.5 MG tablet Take 1 tablet by mouth 2 (two) times daily. 01/19/21  Yes [provider]  liothyronine (CYTOMEL) 25 MCG tablet Take 12.5-25 mcg by mouth See admin instructions. Taking one tablet in the AM and 1/2 tablet at noon  or lunch time.   Yes [provider]  LORazepam (ATIVAN) 2 MG tablet Take 2-4 mg by mouth at bedtime as needed for anxiety or sleep. 05/06/20  Yes [provider]  Magnesium 300 MG CAPS Take 300 mg by mouth daily.   Yes [provider]  magnesium hydroxide (DULCOLAX) 400 MG/5ML suspension Take 15 mLs by mouth daily. For anal tear   Yes [provider]  Melatonin 10 MG TABS Take 50 mg by mouth at bedtime as needed (sleep). 12/09/16  Yes [provider]  Multiple Vitamin (MULTIVITAMIN) capsule Take 1 capsule by mouth daily.   Yes [provider]  oxyCODONE-acetaminophen (PERCOCET) 10-325 MG tablet Take 1 tablet by mouth every 6 (six) hours as needed for pain. 01/15/21  Yes [provider]  metoprolol succinate (TOPROL-XL) 100 MG 24 hr tablet 1 tablet Patient not taking: Reported on 01/28/2021    [provider]     History reviewed. No pertinent family history.  Social  History   Socioeconomic History   Marital status: Single    Spouse name: Not on file   Number of children: Not on file   Years of education: Not on file   Highest education level: Not on file  Occupational History   Not on file  Tobacco Use   Smoking status: Never   Smokeless tobacco:  Never  Vaping Use   Vaping Use: Never used  Substance and Sexual Activity   Alcohol use: Yes    Comment: socially   Drug use: No   Sexual activity: Yes    Birth control/protection: Post-menopausal  Other Topics Concern   Not on file  Social History Narrative   Not on file   Social Determinants of Health   Financial Resource Strain: Not on file  Food Insecurity: Not on file  Transportation Needs: Not on file  Physical Activity: Not on file  Stress: Not on file  Social Connections: Not on file     Review of Systems: A 12 point ROS discussed and pertinent positives are indicated in the HPI above.  All other systems are negative.  Review of Systems  Vital Signs: BP (!) 148/85 (BP Location: Left Arm)    Pulse 97    Temp 98.3 F (36.8 C) (Oral)    Resp 17    Ht 5' 2.5" (1.588 m)    Wt 75.7 kg    LMP 06/30/2011    SpO2 100%    BMI 30.04 kg/m   Physical Exam Vitals reviewed.  Constitutional:      Appearance: Normal appearance.     Comments: In obvious pain and tearful.  Eyes:     Extraocular Movements: Extraocular movements intact.  Cardiovascular:     Rate and Rhythm: Normal rate and regular rhythm.  Pulmonary:     Effort: Pulmonary effort is normal. No respiratory distress.  Abdominal:     Palpations: Abdomen is soft.     Tenderness: There is abdominal tenderness.  Musculoskeletal:        General: Normal range of motion.  Skin:    General: Skin is warm and dry.  Neurological:     General: No focal deficit present.     Mental Status: She is alert and oriented to person, place, and time.  Psychiatric:        Mood and Affect: Mood normal.        Behavior: Behavior normal.        Thought Content: Thought content normal.        Judgment: Judgment normal.    Imaging: MR ABDOMEN W WO CONTRAST  Result Date: 01/28/2021 CLINICAL DATA:  Pancreatic lesion seen on recent CT. EXAM: MRI ABDOMEN WITHOUT AND WITH CONTRAST TECHNIQUE: Multiplanar multisequence MR imaging of  the abdomen was performed both before and after the administration of intravenous contrast. CONTRAST:  18mL GADAVIST GADOBUTROL 1 MMOL/ML IV SOLN COMPARISON:  CT scan 06/27/2021 FINDINGS: Lower chest: Unremarkable. Hepatobiliary: 1.7 cm subcapsular lesion identified inferior right liver, demonstrating restricted diffusion and increased T2 signal. After IV contrast administration there is irregular rim enhancement (axial arterial phase image 54/20 and coronal postcontrast 29/31). There is no evidence for gallstones, gallbladder wall thickening, or pericholecystic fluid. No intrahepatic or extrahepatic biliary dilation. Pancreas: As noted on recent CT scan, there is a hypoenhancing lesion in the tail of pancreas demonstrating rim enhancement today. Lesion measures 2.5 x 2.1 cm on postcontrast image 43 of series 24 and the lesion restricts diffusion. Main pancreatic duct appears to  abruptly cut off at the margin of this lesion. Spleen:  Splenic volume upper normal.  No focal abnormality. Adrenals/Urinary Tract: No adrenal nodule or mass. Tiny foci of non enhancement in both kidneys are too small to characterize but likely cyst. Stomach/Bowel: Stomach is unremarkable. No gastric wall thickening. No evidence of outlet obstruction. Duodenum is normally positioned as is the ligament of Treitz. No small bowel or colonic dilatation within the visualized abdomen. Vascular/Lymphatic: No abdominal aortic aneurysm. Ill-defined lymph nodes are seen in the hepatoduodenal ligament measuring up to 11 mm short axis diameter (axial T2 image 17 of series 8). Prominent ill-defined lymph node identified just to the right of the celiac axis on image 18/8 measures 1.6 cm short axis. All of these lymph nodes are strict diffusion on diffusion-weighted imaging. There is marked attenuation of the main portal vein just distal to the portal splenic confluence (axial postcontrast image 40 of series 24 and coronal postcontrast image 23 of series  31). As noted on previous CT, thrombus is identified in the splenic vein. Other:  No intraperitoneal free fluid. Musculoskeletal: 1.4 cm T1 hypointense, T2 isointense, rim enhancing lesion is identified in the lower thoracic spine at approximately T10. IMPRESSION: 1. 2.5 x 2.1 cm hypoenhancing lesion in the tail of pancreas with peripheral enhancement. Imaging features highly concerning for pancreatic adenocarcinoma. Endoscopic ultrasound would likely prove helpful to further evaluate. 2. Ill-defined lymph nodes in the hepatoduodenal ligament and just to the right of the celiac axis. This lymphadenopathy is associated with marked attenuation of the main portal vein just distal to the portal splenic confluence, presumably secondary to mass-effect/vascular involvement by the lymphadenopathy 3. 1.7 cm subcapsular lesion identified inferior right liver with irregular rim enhancement. Imaging features highly concerning for metastatic disease. 4. 1.4 cm rim enhancing lesion in the lower thoracic spine at approximately T10. Metastatic disease a concern. 5. As noted on prior CT, thrombus is identified in the splenic vein with splenic volume calculated at upper normal. Electronically Signed   By: Misty Stanley M.D.   On: 01/28/2021 13:15   CT Abdomen Pelvis W Contrast  Result Date: 01/27/2021 CLINICAL DATA:  Abdominal pain. EXAM: CT ABDOMEN AND PELVIS WITH CONTRAST TECHNIQUE: Multidetector CT imaging of the abdomen and pelvis was performed using the standard protocol following bolus administration of intravenous contrast. RADIATION DOSE REDUCTION: This exam was performed according to the departmental dose-optimization program which includes automated exposure control, adjustment of the mA and/or kV according to patient size and/or use of iterative reconstruction technique. CONTRAST:  116mL OMNIPAQUE IOHEXOL 300 MG/ML  SOLN COMPARISON:  None. FINDINGS: Lower chest: No acute abnormality. Hepatobiliary: No focal liver  abnormality is seen. A 1.7 cm x 1.7 cm x 1.9 cm low-attenuation soft tissue mass is seen within the periportal region (approximately 32.62 Hounsfield units). Mass effect on the portal vein is noted with subsequent marked severity portal vein narrowing (sagittal reformatted images 74 through 78, CT series 5). No gallstones, gallbladder wall thickening, or biliary dilatation. Pancreas: Adjacent 2.6 cm x 1.8 cm x 2.2 cm and 2.8 cm x 1.4 cm x 1.2 cm low-attenuation pancreatic masses are seen within the pancreatic tail (approximately 32.19 Hounsfield units). There is no evidence of pancreatic ductal dilatation. Spleen: Normal in size without focal abnormality. Adrenals/Urinary Tract: Adrenal glands are unremarkable. Kidneys are normal, without renal calculi, focal lesion, or hydronephrosis. Bladder is unremarkable. Stomach/Bowel: Stomach is within normal limits. Appendix appears normal. A large amount of stool is seen throughout the large bowel. This  is most prominent within the sigmoid colon. No evidence of bowel wall thickening, distention, or inflammatory changes. Vascular/Lymphatic: Aortic atherosclerosis. Extensive intraluminal filling defects are seen within the splenic vein. No enlarged abdominal or pelvic lymph nodes. Reproductive: Uterus and bilateral adnexa are unremarkable. Other: No abdominal wall hernia or abnormality. No abdominopelvic ascites. Musculoskeletal: Marked severity degenerative changes are seen at the level of L5-S1. IMPRESSION: 1. Marked amount of splenic vein thrombus. 2. Pancreatic masses, as described above, worrisome for an underlying pancreatic neoplasm. MRI correlation is recommended. 3. Low attenuation periportal lesion concerning for metastatic disease with subsequent marked severity narrowing of the portal vein. 4. Aortic atherosclerosis. 5. Marked severity degenerative changes at the level of L5-S1. Aortic Atherosclerosis (ICD10-I70.0). Electronically Signed   By: Virgina Norfolk  M.D.   On: 01/27/2021 20:22   MR 3D Recon At Scanner  Result Date: 01/28/2021 CLINICAL DATA:  Pancreatic lesion seen on recent CT. EXAM: MRI ABDOMEN WITHOUT AND WITH CONTRAST TECHNIQUE: Multiplanar multisequence MR imaging of the abdomen was performed both before and after the administration of intravenous contrast. CONTRAST:  60mL GADAVIST GADOBUTROL 1 MMOL/ML IV SOLN COMPARISON:  CT scan 06/27/2021 FINDINGS: Lower chest: Unremarkable. Hepatobiliary: 1.7 cm subcapsular lesion identified inferior right liver, demonstrating restricted diffusion and increased T2 signal. After IV contrast administration there is irregular rim enhancement (axial arterial phase image 54/20 and coronal postcontrast 29/31). There is no evidence for gallstones, gallbladder wall thickening, or pericholecystic fluid. No intrahepatic or extrahepatic biliary dilation. Pancreas: As noted on recent CT scan, there is a hypoenhancing lesion in the tail of pancreas demonstrating rim enhancement today. Lesion measures 2.5 x 2.1 cm on postcontrast image 43 of series 24 and the lesion restricts diffusion. Main pancreatic duct appears to abruptly cut off at the margin of this lesion. Spleen:  Splenic volume upper normal.  No focal abnormality. Adrenals/Urinary Tract: No adrenal nodule or mass. Tiny foci of non enhancement in both kidneys are too small to characterize but likely cyst. Stomach/Bowel: Stomach is unremarkable. No gastric wall thickening. No evidence of outlet obstruction. Duodenum is normally positioned as is the ligament of Treitz. No small bowel or colonic dilatation within the visualized abdomen. Vascular/Lymphatic: No abdominal aortic aneurysm. Ill-defined lymph nodes are seen in the hepatoduodenal ligament measuring up to 11 mm short axis diameter (axial T2 image 17 of series 8). Prominent ill-defined lymph node identified just to the right of the celiac axis on image 18/8 measures 1.6 cm short axis. All of these lymph nodes are  strict diffusion on diffusion-weighted imaging. There is marked attenuation of the main portal vein just distal to the portal splenic confluence (axial postcontrast image 40 of series 24 and coronal postcontrast image 23 of series 31). As noted on previous CT, thrombus is identified in the splenic vein. Other:  No intraperitoneal free fluid. Musculoskeletal: 1.4 cm T1 hypointense, T2 isointense, rim enhancing lesion is identified in the lower thoracic spine at approximately T10. IMPRESSION: 1. 2.5 x 2.1 cm hypoenhancing lesion in the tail of pancreas with peripheral enhancement. Imaging features highly concerning for pancreatic adenocarcinoma. Endoscopic ultrasound would likely prove helpful to further evaluate. 2. Ill-defined lymph nodes in the hepatoduodenal ligament and just to the right of the celiac axis. This lymphadenopathy is associated with marked attenuation of the main portal vein just distal to the portal splenic confluence, presumably secondary to mass-effect/vascular involvement by the lymphadenopathy 3. 1.7 cm subcapsular lesion identified inferior right liver with irregular rim enhancement. Imaging features highly concerning for metastatic  disease. 4. 1.4 cm rim enhancing lesion in the lower thoracic spine at approximately T10. Metastatic disease a concern. 5. As noted on prior CT, thrombus is identified in the splenic vein with splenic volume calculated at upper normal. Electronically Signed   By: Misty Stanley M.D.   On: 01/28/2021 13:15    Labs:  CBC: Recent Labs    01/27/21 1843 01/28/21 0608 01/28/21 1104 01/29/21 0502  WBC 10.7* 9.5 8.3 7.9  HGB 12.7 11.7* 11.1* 10.9*  HCT 37.9 36.0 34.2* 34.1*  PLT 229 209 180 183    COAGS: Recent Labs    01/28/21 0130  INR 1.0  APTT 24    BMP: Recent Labs    01/27/21 1843 01/28/21 0608  NA 135 136  K 4.3 4.1  CL 100 105  CO2 27 25  GLUCOSE 122* 123*  BUN 25* 23  CALCIUM 9.4 8.6*  CREATININE 0.77 0.64  GFRNONAA >60 >60     LIVER FUNCTION TESTS: Recent Labs    01/27/21 1843 01/28/21 0608  BILITOT 0.6 0.5  AST 20 19  ALT 24 22  ALKPHOS 84 76  PROT 6.8 6.4*  ALBUMIN 3.8 3.4*    TUMOR MARKERS: No results for input(s): AFPTM, CEA, CA199, CHROMGRNA in the last 8760 hours.  Assessment and Plan:  Liver lesions worrisome for metastatic pancreatic cancer.  Images reviewed by Dr. Laurence Ferrari.  Will proceed with image guided biopsy tomorrow by Dr. Anselm Pancoast.  Risks and benefits of [name]  was discussed with the patient and/or patient's family including, but not limited to bleeding, infection, damage to adjacent structures or low yield requiring additional tests.  All of the questions were answered and there is agreement to proceed.  Consent signed and in chart.  Thank you for allowing our service to participate in Gloria Ward 's care.  Electronically Signed: Murrell Redden, PA-C   01/29/2021, 12:52 PM      I spent a total of 40 Minutes in face to face in clinical consultation, greater than 50% of which was counseling/coordinating care for liver lesion biopsy.

## 2021-01-29 NOTE — Assessment & Plan Note (Addendum)
-   Found on imaging on admission and confirmed on MRI abdomen.  Likely related to underlying malignancy work-up - s/p hematemesis on 1/22, ongoing melena and now further drop in Hgb - discontinue heparin drip at this time - trend H/H

## 2021-01-29 NOTE — Consult Note (Signed)
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  According to the patient, she has been having pain since about end of November or beginning of December.  Pain has gotten very worse and she is unable to sit in her office chair to work.  She notices the pain is mostly in bilateral upper quadrants but also radiates to her back and to the lower quadrants.  She points to her buttock as the area of pain as well.  She has been taking Aleve 3 tablets a day and Motrin as well as some other pain medication and that she has noticed some black tarry stools which made her come to the hospital.  Besides the abdominal pain, she has not noticed any weight loss.  She is very healthy at baseline, denies any past medical history.  She is very healthy eater, exercises regularly and believes in natural supplements.  She denies any family history of pancreatic cancer although she makes a statement that it might be on some side of the family. Breast cancer also on paternal side.  She denies any smoking, drinks socially. She had CT which showed mass in the pancreatic tail concerning for underlying malignancy, soft tissue in the periportal region of her liver concerning for metastasis. MRI abdomen showed 2.5 x 2.1 hypoenhancing lesion in the tail of pancreas with peripheral enhancement highly suggestive for pancreatic adenocarcinoma.  Ill-defined lymph nodes in the hepatoduodenal ligament and just to the right of celiac axis, lymphadenopathy associated with marked attenuation of main portal vein just distal to the portal  splenic confluence presumably secondary to mass-effect/vascular involvement by lymphadenopathy.  1.7 cm subcapsular lesion identified in the inferior right liver with irregular rim enhancement highly concerning for metastatic disease. 1.4 cm rim-enhancing lesion in the lower thoracic spine at approximately T10, metastatic disease is a concern. She was by herself at the time of my visit.  She tells me that she has been thinking about this a lot but she is not ready for the discussion until biopsy results are available.  Besides the above-mentioned complaints, she denies any chronic issues.  Rest of the pertinent 10 point ROS reviewed and negative.  Past Medical History:  Diagnosis Date   Anxiety    Heart murmur    no prob - no med   Hypertension    Hypothyroidism    Insomnia   :   Past Surgical History:  Procedure Laterality Date   AUGMENTATION MAMMAPLASTY     CESAREAN SECTION  1986, 1989   COSMETIC SURGERY     face lift   dental implants     DILATATION & CURRETTAGE/HYSTEROSCOPY WITH RESECTOCOPE  11/30/2011   Procedure: DILATATION & CURETTAGE/HYSTEROSCOPY WITH RESECTOCOPE;  Surgeon: Peri Maris, MD;  Location: [facility];  Service: Gynecology;  Laterality: N/A;   EYE SURGERY     right eye  :   Current Facility-Administered Medications  Medication Dose Route Frequency Provider Last Rate Last Admin   acetaminophen (TYLENOL) tablet 650 mg  650 mg Oral Q6H PRN Chotiner, Yevonne Aline, MD  Or   acetaminophen (TYLENOL) suppository 650 mg  650 mg Rectal Q6H PRN Chotiner, Yevonne Aline, MD       heparin ADULT infusion 100 units/mL (25000 units/261mL)  1,100 Units/hr Intravenous Continuous Dimple Nanas, RPH 11 mL/hr at 01/29/21 1544 1,100 Units/hr at 01/29/21 1544   HYDROmorphone (DILAUDID) injection 1 mg  1 mg Intravenous Q2H PRN Dwyane Dee, MD   1 mg at 01/29/21 1531   lactated ringers infusion   Intravenous Continuous Chotiner, Yevonne Aline, MD 100 mL/hr at 01/29/21 0607 New Bag at  01/29/21 0607   liothyronine (CYTOMEL) tablet 12.5 mcg  12.5 mcg Oral QAC lunch Dwyane Dee, MD   12.5 mcg at 01/29/21 1245   liothyronine (CYTOMEL) tablet 25 mcg  25 mcg Oral Daily Chotiner, Yevonne Aline, MD   25 mcg at 01/29/21 1024   LORazepam (ATIVAN) tablet 2 mg  2 mg Oral BID PRN Dwyane Dee, MD       melatonin tablet 10 mg  10 mg Oral QHS PRN Chotiner, Yevonne Aline, MD       ondansetron [facility]) tablet 4 mg  4 mg Oral Q6H PRN Chotiner, Yevonne Aline, MD       Or   ondansetron (ZOFRAN) injection 4 mg  4 mg Intravenous Q6H PRN Chotiner, Yevonne Aline, MD       pantoprazole (PROTONIX) injection 40 mg  40 mg Intravenous Q12H Brahmbhatt, Parag, MD       senna-docusate (Senokot-S) tablet 1 tablet  1 tablet Oral QHS PRN Chotiner, Yevonne Aline, MD          Allergies  Allergen Reactions   Amlodipine Swelling   Tramadol Hcl Other (See Comments)   Trazodone Hcl Other (See Comments)   Penicillins Rash and Other (See Comments)    Rash and hot all over. Has tolerated Keflex  :  History reviewed. No pertinent family history.:   Social History   Socioeconomic History   Marital status: Single    Spouse name: Not on file   Number of children: Not on file   Years of education: Not on file   Highest education level: Not on file  Occupational History   Not on file  Tobacco Use   Smoking status: Never   Smokeless tobacco: Never  Vaping Use   Vaping Use: Never used  Substance and Sexual Activity   Alcohol use: Yes    Comment: socially   Drug use: No   Sexual activity: Yes    Birth control/protection: Post-menopausal  Other Topics Concern   Not on file  Social History Narrative   Not on file   Social Determinants of Health   Financial Resource Strain: Not on file  Food Insecurity: Not on file  Transportation Needs: Not on file  Physical Activity: Not on file  Stress: Not on file  Social Connections: Not on file  Intimate Partner Violence: Not on file    Exam: Patient Vitals for  the past 24 hrs:  BP Temp Temp src Pulse Resp SpO2 Weight  01/29/21 1638 (!) 158/92 98 F (36.7 C) Oral 88 18 98 % --  01/29/21 1210 (!) 148/85 98.3 F (36.8 C) Oral 97 17 -- --  01/29/21 0943 (!) 173/91 98 F (36.7 C) Oral (!) 112 19 -- --  01/29/21 0636 (!) 149/75 98 F (36.7 C) Oral 89 18 100 % --  01/29/21 0204 (!) 144/77 97.8 F (36.6 C) Oral 94 18 99 % --  01/28/21 2132 (!) 143/74 98.2 F (36.8 C)  Oral 90 18 99 % --  01/28/21 1956 -- -- -- -- -- -- 166 lb 14.4 oz (75.7 kg)  01/28/21 1853 138/83 -- -- (!) 102 16 99 % --    Physical Exam Constitutional:      Appearance: She is well-developed. She is not ill-appearing.  HENT:     Head: Normocephalic and atraumatic.  Cardiovascular:     Rate and Rhythm: Normal rate and regular rhythm.  Pulmonary:     Effort: Pulmonary effort is normal.  Abdominal:     General: There is distension.     Tenderness: There is abdominal tenderness in the epigastric area. There is no guarding.  Skin:    General: Skin is warm and dry.     Coloration: Skin is not jaundiced.  Neurological:     General: No focal deficit present.     Mental Status: She is alert.    Lab Results  Component Value Date   WBC 7.9 01/29/2021   HGB 10.9 (L) 01/29/2021   HCT 34.1 (L) 01/29/2021   PLT 183 01/29/2021   GLUCOSE 123 (H) 01/28/2021   CHOL  06/17/2009    81        ATP III CLASSIFICATION:  <200     mg/dL   Desirable  200-239  mg/dL   Borderline High  >=240    mg/dL   High          TRIG 122 06/17/2009   HDL 33 (L) 06/17/2009   LDLCALC  06/17/2009    24        Total Cholesterol/HDL:CHD Risk Coronary Heart Disease Risk Table                     Men   Women  1/2 Average Risk   3.4   3.3  Average Risk       5.0   4.4  2 X Average Risk   9.6   7.1  3 X Average Risk  23.4   11.0        Use the calculated Patient Ratio above and the CHD Risk Table to determine the patient's CHD Risk.        ATP III CLASSIFICATION (LDL):  <100     mg/dL    Optimal  100-129  mg/dL   Near or Above                    Optimal  130-159  mg/dL   Borderline  160-189  mg/dL   High  >190     mg/dL   Very High   ALT 22 01/28/2021   AST 19 01/28/2021   NA 136 01/28/2021   K 4.1 01/28/2021   CL 105 01/28/2021   CREATININE 0.64 01/28/2021   BUN 23 01/28/2021   CO2 25 01/28/2021    MR ABDOMEN W WO CONTRAST  Result Date: 01/28/2021 CLINICAL DATA:  Pancreatic lesion seen on recent CT. EXAM: MRI ABDOMEN WITHOUT AND WITH CONTRAST TECHNIQUE: Multiplanar multisequence MR imaging of the abdomen was performed both before and after the administration of intravenous contrast. CONTRAST:  28mL GADAVIST GADOBUTROL 1 MMOL/ML IV SOLN COMPARISON:  CT scan 06/27/2021 FINDINGS: Lower chest: Unremarkable. Hepatobiliary: 1.7 cm subcapsular lesion identified inferior right liver, demonstrating restricted diffusion and increased T2 signal. After IV contrast administration there is irregular rim enhancement (axial arterial phase image 54/20 and coronal postcontrast 29/31). There is no evidence for gallstones, gallbladder wall thickening, or pericholecystic fluid. No intrahepatic  or extrahepatic biliary dilation. Pancreas: As noted on recent CT scan, there is a hypoenhancing lesion in the tail of pancreas demonstrating rim enhancement today. Lesion measures 2.5 x 2.1 cm on postcontrast image 43 of series 24 and the lesion restricts diffusion. Main pancreatic duct appears to abruptly cut off at the margin of this lesion. Spleen:  Splenic volume upper normal.  No focal abnormality. Adrenals/Urinary Tract: No adrenal nodule or mass. Tiny foci of non enhancement in both kidneys are too small to characterize but likely cyst. Stomach/Bowel: Stomach is unremarkable. No gastric wall thickening. No evidence of outlet obstruction. Duodenum is normally positioned as is the ligament of Treitz. No small bowel or colonic dilatation within the visualized abdomen. Vascular/Lymphatic: No abdominal aortic  aneurysm. Ill-defined lymph nodes are seen in the hepatoduodenal ligament measuring up to 11 mm short axis diameter (axial T2 image 17 of series 8). Prominent ill-defined lymph node identified just to the right of the celiac axis on image 18/8 measures 1.6 cm short axis. All of these lymph nodes are strict diffusion on diffusion-weighted imaging. There is marked attenuation of the main portal vein just distal to the portal splenic confluence (axial postcontrast image 40 of series 24 and coronal postcontrast image 23 of series 31). As noted on previous CT, thrombus is identified in the splenic vein. Other:  No intraperitoneal free fluid. Musculoskeletal: 1.4 cm T1 hypointense, T2 isointense, rim enhancing lesion is identified in the lower thoracic spine at approximately T10. IMPRESSION: 1. 2.5 x 2.1 cm hypoenhancing lesion in the tail of pancreas with peripheral enhancement. Imaging features highly concerning for pancreatic adenocarcinoma. Endoscopic ultrasound would likely prove helpful to further evaluate. 2. Ill-defined lymph nodes in the hepatoduodenal ligament and just to the right of the celiac axis. This lymphadenopathy is associated with marked attenuation of the main portal vein just distal to the portal splenic confluence, presumably secondary to mass-effect/vascular involvement by the lymphadenopathy 3. 1.7 cm subcapsular lesion identified inferior right liver with irregular rim enhancement. Imaging features highly concerning for metastatic disease. 4. 1.4 cm rim enhancing lesion in the lower thoracic spine at approximately T10. Metastatic disease a concern. 5. As noted on prior CT, thrombus is identified in the splenic vein with splenic volume calculated at upper normal. Electronically Signed   By: Misty Stanley M.D.   On: 01/28/2021 13:15   CT Abdomen Pelvis W Contrast  Result Date: 01/27/2021 CLINICAL DATA:  Abdominal pain. EXAM: CT ABDOMEN AND PELVIS WITH CONTRAST TECHNIQUE: Multidetector CT  imaging of the abdomen and pelvis was performed using the standard protocol following bolus administration of intravenous contrast. RADIATION DOSE REDUCTION: This exam was performed according to the departmental dose-optimization program which includes automated exposure control, adjustment of the mA and/or kV according to patient size and/or use of iterative reconstruction technique. CONTRAST:  161mL OMNIPAQUE IOHEXOL 300 MG/ML  SOLN COMPARISON:  None. FINDINGS: Lower chest: No acute abnormality. Hepatobiliary: No focal liver abnormality is seen. A 1.7 cm x 1.7 cm x 1.9 cm low-attenuation soft tissue mass is seen within the periportal region (approximately 32.62 Hounsfield units). Mass effect on the portal vein is noted with subsequent marked severity portal vein narrowing (sagittal reformatted images 74 through 78, CT series 5). No gallstones, gallbladder wall thickening, or biliary dilatation. Pancreas: Adjacent 2.6 cm x 1.8 cm x 2.2 cm and 2.8 cm x 1.4 cm x 1.2 cm low-attenuation pancreatic masses are seen within the pancreatic tail (approximately 32.19 Hounsfield units). There is no evidence of pancreatic ductal  dilatation. Spleen: Normal in size without focal abnormality. Adrenals/Urinary Tract: Adrenal glands are unremarkable. Kidneys are normal, without renal calculi, focal lesion, or hydronephrosis. Bladder is unremarkable. Stomach/Bowel: Stomach is within normal limits. Appendix appears normal. A large amount of stool is seen throughout the large bowel. This is most prominent within the sigmoid colon. No evidence of bowel wall thickening, distention, or inflammatory changes. Vascular/Lymphatic: Aortic atherosclerosis. Extensive intraluminal filling defects are seen within the splenic vein. No enlarged abdominal or pelvic lymph nodes. Reproductive: Uterus and bilateral adnexa are unremarkable. Other: No abdominal wall hernia or abnormality. No abdominopelvic ascites. Musculoskeletal: Marked severity  degenerative changes are seen at the level of L5-S1. IMPRESSION: 1. Marked amount of splenic vein thrombus. 2. Pancreatic masses, as described above, worrisome for an underlying pancreatic neoplasm. MRI correlation is recommended. 3. Low attenuation periportal lesion concerning for metastatic disease with subsequent marked severity narrowing of the portal vein. 4. Aortic atherosclerosis. 5. Marked severity degenerative changes at the level of L5-S1. Aortic Atherosclerosis (ICD10-I70.0). Electronically Signed   By: Virgina Norfolk M.D.   On: 01/27/2021 20:22   MR 3D Recon At Scanner  Result Date: 01/28/2021 CLINICAL DATA:  Pancreatic lesion seen on recent CT. EXAM: MRI ABDOMEN WITHOUT AND WITH CONTRAST TECHNIQUE: Multiplanar multisequence MR imaging of the abdomen was performed both before and after the administration of intravenous contrast. CONTRAST:  47mL GADAVIST GADOBUTROL 1 MMOL/ML IV SOLN COMPARISON:  CT scan 06/27/2021 FINDINGS: Lower chest: Unremarkable. Hepatobiliary: 1.7 cm subcapsular lesion identified inferior right liver, demonstrating restricted diffusion and increased T2 signal. After IV contrast administration there is irregular rim enhancement (axial arterial phase image 54/20 and coronal postcontrast 29/31). There is no evidence for gallstones, gallbladder wall thickening, or pericholecystic fluid. No intrahepatic or extrahepatic biliary dilation. Pancreas: As noted on recent CT scan, there is a hypoenhancing lesion in the tail of pancreas demonstrating rim enhancement today. Lesion measures 2.5 x 2.1 cm on postcontrast image 43 of series 24 and the lesion restricts diffusion. Main pancreatic duct appears to abruptly cut off at the margin of this lesion. Spleen:  Splenic volume upper normal.  No focal abnormality. Adrenals/Urinary Tract: No adrenal nodule or mass. Tiny foci of non enhancement in both kidneys are too small to characterize but likely cyst. Stomach/Bowel: Stomach is unremarkable.  No gastric wall thickening. No evidence of outlet obstruction. Duodenum is normally positioned as is the ligament of Treitz. No small bowel or colonic dilatation within the visualized abdomen. Vascular/Lymphatic: No abdominal aortic aneurysm. Ill-defined lymph nodes are seen in the hepatoduodenal ligament measuring up to 11 mm short axis diameter (axial T2 image 17 of series 8). Prominent ill-defined lymph node identified just to the right of the celiac axis on image 18/8 measures 1.6 cm short axis. All of these lymph nodes are strict diffusion on diffusion-weighted imaging. There is marked attenuation of the main portal vein just distal to the portal splenic confluence (axial postcontrast image 40 of series 24 and coronal postcontrast image 23 of series 31). As noted on previous CT, thrombus is identified in the splenic vein. Other:  No intraperitoneal free fluid. Musculoskeletal: 1.4 cm T1 hypointense, T2 isointense, rim enhancing lesion is identified in the lower thoracic spine at approximately T10. IMPRESSION: 1. 2.5 x 2.1 cm hypoenhancing lesion in the tail of pancreas with peripheral enhancement. Imaging features highly concerning for pancreatic adenocarcinoma. Endoscopic ultrasound would likely prove helpful to further evaluate. 2. Ill-defined lymph nodes in the hepatoduodenal ligament and just to the right of the  celiac axis. This lymphadenopathy is associated with marked attenuation of the main portal vein just distal to the portal splenic confluence, presumably secondary to mass-effect/vascular involvement by the lymphadenopathy 3. 1.7 cm subcapsular lesion identified inferior right liver with irregular rim enhancement. Imaging features highly concerning for metastatic disease. 4. 1.4 cm rim enhancing lesion in the lower thoracic spine at approximately T10. Metastatic disease a concern. 5. As noted on prior CT, thrombus is identified in the splenic vein with splenic volume calculated at upper normal.  Electronically Signed   By: Misty Stanley M.D.   On: 01/28/2021 13:15     MR ABDOMEN W WO CONTRAST  Result Date: 01/28/2021 CLINICAL DATA:  Pancreatic lesion seen on recent CT. EXAM: MRI ABDOMEN WITHOUT AND WITH CONTRAST TECHNIQUE: Multiplanar multisequence MR imaging of the abdomen was performed both before and after the administration of intravenous contrast. CONTRAST:  69mL GADAVIST GADOBUTROL 1 MMOL/ML IV SOLN COMPARISON:  CT scan 06/27/2021 FINDINGS: Lower chest: Unremarkable. Hepatobiliary: 1.7 cm subcapsular lesion identified inferior right liver, demonstrating restricted diffusion and increased T2 signal. After IV contrast administration there is irregular rim enhancement (axial arterial phase image 54/20 and coronal postcontrast 29/31). There is no evidence for gallstones, gallbladder wall thickening, or pericholecystic fluid. No intrahepatic or extrahepatic biliary dilation. Pancreas: As noted on recent CT scan, there is a hypoenhancing lesion in the tail of pancreas demonstrating rim enhancement today. Lesion measures 2.5 x 2.1 cm on postcontrast image 43 of series 24 and the lesion restricts diffusion. Main pancreatic duct appears to abruptly cut off at the margin of this lesion. Spleen:  Splenic volume upper normal.  No focal abnormality. Adrenals/Urinary Tract: No adrenal nodule or mass. Tiny foci of non enhancement in both kidneys are too small to characterize but likely cyst. Stomach/Bowel: Stomach is unremarkable. No gastric wall thickening. No evidence of outlet obstruction. Duodenum is normally positioned as is the ligament of Treitz. No small bowel or colonic dilatation within the visualized abdomen. Vascular/Lymphatic: No abdominal aortic aneurysm. Ill-defined lymph nodes are seen in the hepatoduodenal ligament measuring up to 11 mm short axis diameter (axial T2 image 17 of series 8). Prominent ill-defined lymph node identified just to the right of the celiac axis on image 18/8 measures 1.6  cm short axis. All of these lymph nodes are strict diffusion on diffusion-weighted imaging. There is marked attenuation of the main portal vein just distal to the portal splenic confluence (axial postcontrast image 40 of series 24 and coronal postcontrast image 23 of series 31). As noted on previous CT, thrombus is identified in the splenic vein. Other:  No intraperitoneal free fluid. Musculoskeletal: 1.4 cm T1 hypointense, T2 isointense, rim enhancing lesion is identified in the lower thoracic spine at approximately T10. IMPRESSION: 1. 2.5 x 2.1 cm hypoenhancing lesion in the tail of pancreas with peripheral enhancement. Imaging features highly concerning for pancreatic adenocarcinoma. Endoscopic ultrasound would likely prove helpful to further evaluate. 2. Ill-defined lymph nodes in the hepatoduodenal ligament and just to the right of the celiac axis. This lymphadenopathy is associated with marked attenuation of the main portal vein just distal to the portal splenic confluence, presumably secondary to mass-effect/vascular involvement by the lymphadenopathy 3. 1.7 cm subcapsular lesion identified inferior right liver with irregular rim enhancement. Imaging features highly concerning for metastatic disease. 4. 1.4 cm rim enhancing lesion in the lower thoracic spine at approximately T10. Metastatic disease a concern. 5. As noted on prior CT, thrombus is identified in the splenic vein with splenic volume  calculated at upper normal. Electronically Signed   By: Misty Stanley M.D.   On: 01/28/2021 13:15   CT Abdomen Pelvis W Contrast  Result Date: 01/27/2021 CLINICAL DATA:  Abdominal pain. EXAM: CT ABDOMEN AND PELVIS WITH CONTRAST TECHNIQUE: Multidetector CT imaging of the abdomen and pelvis was performed using the standard protocol following bolus administration of intravenous contrast. RADIATION DOSE REDUCTION: This exam was performed according to the departmental dose-optimization program which includes automated  exposure control, adjustment of the mA and/or kV according to patient size and/or use of iterative reconstruction technique. CONTRAST:  147mL OMNIPAQUE IOHEXOL 300 MG/ML  SOLN COMPARISON:  None. FINDINGS: Lower chest: No acute abnormality. Hepatobiliary: No focal liver abnormality is seen. A 1.7 cm x 1.7 cm x 1.9 cm low-attenuation soft tissue mass is seen within the periportal region (approximately 32.62 Hounsfield units). Mass effect on the portal vein is noted with subsequent marked severity portal vein narrowing (sagittal reformatted images 74 through 78, CT series 5). No gallstones, gallbladder wall thickening, or biliary dilatation. Pancreas: Adjacent 2.6 cm x 1.8 cm x 2.2 cm and 2.8 cm x 1.4 cm x 1.2 cm low-attenuation pancreatic masses are seen within the pancreatic tail (approximately 32.19 Hounsfield units). There is no evidence of pancreatic ductal dilatation. Spleen: Normal in size without focal abnormality. Adrenals/Urinary Tract: Adrenal glands are unremarkable. Kidneys are normal, without renal calculi, focal lesion, or hydronephrosis. Bladder is unremarkable. Stomach/Bowel: Stomach is within normal limits. Appendix appears normal. A large amount of stool is seen throughout the large bowel. This is most prominent within the sigmoid colon. No evidence of bowel wall thickening, distention, or inflammatory changes. Vascular/Lymphatic: Aortic atherosclerosis. Extensive intraluminal filling defects are seen within the splenic vein. No enlarged abdominal or pelvic lymph nodes. Reproductive: Uterus and bilateral adnexa are unremarkable. Other: No abdominal wall hernia or abnormality. No abdominopelvic ascites. Musculoskeletal: Marked severity degenerative changes are seen at the level of L5-S1. IMPRESSION: 1. Marked amount of splenic vein thrombus. 2. Pancreatic masses, as described above, worrisome for an underlying pancreatic neoplasm. MRI correlation is recommended. 3. Low attenuation periportal lesion  concerning for metastatic disease with subsequent marked severity narrowing of the portal vein. 4. Aortic atherosclerosis. 5. Marked severity degenerative changes at the level of L5-S1. Aortic Atherosclerosis (ICD10-I70.0). Electronically Signed   By: Virgina Norfolk M.D.   On: 01/27/2021 20:22   MR 3D Recon At Scanner  Result Date: 01/28/2021 CLINICAL DATA:  Pancreatic lesion seen on recent CT. EXAM: MRI ABDOMEN WITHOUT AND WITH CONTRAST TECHNIQUE: Multiplanar multisequence MR imaging of the abdomen was performed both before and after the administration of intravenous contrast. CONTRAST:  8mL GADAVIST GADOBUTROL 1 MMOL/ML IV SOLN COMPARISON:  CT scan 06/27/2021 FINDINGS: Lower chest: Unremarkable. Hepatobiliary: 1.7 cm subcapsular lesion identified inferior right liver, demonstrating restricted diffusion and increased T2 signal. After IV contrast administration there is irregular rim enhancement (axial arterial phase image 54/20 and coronal postcontrast 29/31). There is no evidence for gallstones, gallbladder wall thickening, or pericholecystic fluid. No intrahepatic or extrahepatic biliary dilation. Pancreas: As noted on recent CT scan, there is a hypoenhancing lesion in the tail of pancreas demonstrating rim enhancement today. Lesion measures 2.5 x 2.1 cm on postcontrast image 43 of series 24 and the lesion restricts diffusion. Main pancreatic duct appears to abruptly cut off at the margin of this lesion. Spleen:  Splenic volume upper normal.  No focal abnormality. Adrenals/Urinary Tract: No adrenal nodule or mass. Tiny foci of non enhancement in both kidneys are too small  to characterize but likely cyst. Stomach/Bowel: Stomach is unremarkable. No gastric wall thickening. No evidence of outlet obstruction. Duodenum is normally positioned as is the ligament of Treitz. No small bowel or colonic dilatation within the visualized abdomen. Vascular/Lymphatic: No abdominal aortic aneurysm. Ill-defined lymph nodes  are seen in the hepatoduodenal ligament measuring up to 11 mm short axis diameter (axial T2 image 17 of series 8). Prominent ill-defined lymph node identified just to the right of the celiac axis on image 18/8 measures 1.6 cm short axis. All of these lymph nodes are strict diffusion on diffusion-weighted imaging. There is marked attenuation of the main portal vein just distal to the portal splenic confluence (axial postcontrast image 40 of series 24 and coronal postcontrast image 23 of series 31). As noted on previous CT, thrombus is identified in the splenic vein. Other:  No intraperitoneal free fluid. Musculoskeletal: 1.4 cm T1 hypointense, T2 isointense, rim enhancing lesion is identified in the lower thoracic spine at approximately T10. IMPRESSION: 1. 2.5 x 2.1 cm hypoenhancing lesion in the tail of pancreas with peripheral enhancement. Imaging features highly concerning for pancreatic adenocarcinoma. Endoscopic ultrasound would likely prove helpful to further evaluate. 2. Ill-defined lymph nodes in the hepatoduodenal ligament and just to the right of the celiac axis. This lymphadenopathy is associated with marked attenuation of the main portal vein just distal to the portal splenic confluence, presumably secondary to mass-effect/vascular involvement by the lymphadenopathy 3. 1.7 cm subcapsular lesion identified inferior right liver with irregular rim enhancement. Imaging features highly concerning for metastatic disease. 4. 1.4 cm rim enhancing lesion in the lower thoracic spine at approximately T10. Metastatic disease a concern. 5. As noted on prior CT, thrombus is identified in the splenic vein with splenic volume calculated at upper normal. Electronically Signed   By: Misty Stanley M.D.   On: 01/28/2021 13:15    Assessment and Plan:   This is a very pleasant 71 year old female patient with chief complaint of abdominal pain found mass in the tail of pancreas, liver lesion and T10 lesion on imaging are  concerning for primary pancreatic malignancy admitted for the above.  She was supposed to have a liver biopsy today however this was postponed.  Physical examination today, patient appears healthy, has some tenderness in the epigastrium but no other major abnormal findings.  No palpable lymphadenopathy or hepatosplenomegaly.  Some bloating noted. She is aware of the imaging findings and is understandably very worried about the possible diagnosis however she mentions that she is not yet ready to talk about it unless we have biopsy results available.  She mentions that she is very healthy at baseline, eats very healthy, exercises and tries to stay very healthy and she is quite surprised by the findings so far.  She does not quite admit to a family history although she says that pancreatic cancer might be on some side of the family. I have reviewed her imaging, I do believe it is concerning for a primary pancreatic malignancy.   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 Per patient's request we will see her after the biopsy results if there is conclusive evidence of malignancy.  The length of time of the face-to-face encounter was 60 minutes. More than 50% of time was spent counseling and coordination of care. I can speak to any other anybody [id number] region 1950-01-25 Thank you for this referral.

## 2021-01-29 NOTE — Progress Notes (Signed)
°   01/29/21 0943  Assess: MEWS Score  Temp 98 F (36.7 C)  BP (!) 173/91  Pulse Rate (!) 112  Resp 19  Level of Consciousness Alert  Assess: MEWS Score  MEWS Temp 0  MEWS Systolic 0  MEWS Pulse 2  MEWS RR 0  MEWS LOC 0  MEWS Score 2  MEWS Score Color Yellow  Treat  Pain Scale 0-10  Pain Score 10  Pain Location Abdomen  2nd Pain Site  Pain Score 10  Pain Type Acute pain  Pain Location Abdomen  Pain Intervention(s) Medication (See eMAR)  Take Vital Signs  Increase Vital Sign Frequency  Yellow: Q 2hr X 2 then Q 4hr X 2, if remains yellow, continue Q 4hrs  Escalate  MEWS: Escalate Yellow: discuss with charge nurse/RN and consider discussing with provider and RRT  Notify: Charge Nurse/RN  Name of Charge Nurse/RN Notified Kim ,RN  Date Charge Nurse/RN Notified 01/29/21  Time Charge Nurse/RN Notified 1000  Notify: Provider  Provider Name/Title Dr  Sabino Gasser  Date Provider Notified 01/29/21  Time Provider Notified 1000  Notification Type Page  Notification Reason Change in status  Provider response See new orders  Date of Provider Response 01/29/21  Document  Patient Outcome Stabilized after interventions  Assess: SIRS CRITERIA  SIRS Temperature  0  SIRS Pulse 1  SIRS Respirations  0  SIRS WBC 0  SIRS Score Sum  1

## 2021-01-29 NOTE — Hospital Course (Signed)
Ms. Makarewicz is a 71 yo female with PMH anxiety, HTN, Hashimoto's thyroiditis who presented to the hospital with approximately 2-week complaint of intermittent abdominal pain also radiating to her back.  She was also having black tarry stools at home.  She endorsed taking Aleve at essentially max dose for approximately 4 weeks prior to admission and was also using Norco for pain control. CT abdomen/pelvis on admission showed a splenic vein thrombus and pancreatic mass with recommendation for MRI to further correlate. 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 She was admitted for GI evaluation and further work-up regarding underlying lesions.

## 2021-01-29 NOTE — Progress Notes (Addendum)
ANTICOAGULATION CONSULT NOTE - Initial Consult  Pharmacy Consult for Heparin Indication: Splenic Vein Thrombus  Allergies  Allergen Reactions   Amlodipine Swelling   Tramadol Hcl Other (See Comments)   Trazodone Hcl Other (See Comments)   Penicillins Rash and Other (See Comments)    Rash and hot all over. Has tolerated Keflex    Patient Measurements: Height: 5' 2.5" (158.8 cm) Weight: 75.7 kg (166 lb 14.4 oz) IBW/kg (Calculated) : 51.25 Heparin Dosing Weight: 68 kg  Vital Signs: Temp: 98.3 F (36.8 C) (01/18 1210) Temp Source: Oral (01/18 1210) BP: 148/85 (01/18 1210) Pulse Rate: 97 (01/18 1210)  Labs: Recent Labs    01/27/21 1843 01/28/21 0130 01/28/21 0608 01/28/21 1104 01/29/21 0502  HGB 12.7  --  11.7* 11.1* 10.9*  HCT 37.9  --  36.0 34.2* 34.1*  PLT 229  --  209 180 183  APTT  --  24  --   --   --   LABPROT  --  13.2  --   --   --   INR  --  1.0  --   --   --   CREATININE 0.77  --  0.64  --   --      Estimated Creatinine Clearance: 64.9 mL/min (by C-G formula based on SCr of 0.64 mg/dL).   Medical History: Past Medical History:  Diagnosis Date   Anxiety    Heart murmur    no prob - no med   Hypertension    Hypothyroidism    Insomnia     Medications:  No oral anticoagulation PTA  Assessment: 71 yr female with abdominal pain s/p abdominal surgery October 2022.  CT of abdomen/pelvis = + splenic vein thrombus as well as pancreatic masses.  Noted patient had reported black stools previously during admit.  Pharmacy was consulted to dose heparin drip without a bolus.    1/17: Hemoccult positive, GI was consulted for GIB work-up, and heparin drip held.  However, since hemoglobin is stable and concern for metastases, GI will not be pursuing further work-up at this time.  Discussed with IR - liver biopsy planned for 1/19 and okay to resume heparin drip in the interim.  IR to advise when heparin drip should be held.   Baseline labs 1/17: aPTT 24, INR  1  Today, 01/29/21 - Hemoglobin stable at 10.9, platelets wnl - No further bloody stools noted  Goal of Therapy:  Heparin level 0.3-0.7 units/ml Monitor platelets by anticoagulation protocol: Yes   Plan:  Resume heparin gtt @ 1100 units/hr (no bolus) Check heparin level 6 hr after heparin started Daily heparin level & CBC Monitor for further blood in stool IR to advise when to hold heparin drip prior to liver biopsy 1/19  Dimple Nanas, PharmD 01/29/2021,2:24 PM

## 2021-01-29 NOTE — Progress Notes (Signed)
[facility] Gastroenterology Progress Note  Gloria Ward 71 y.o. 02/03/1950  CC:  Abdominal pain   Subjective: Patient states she is in immense pain. Pain is in abdomen and radiates to the back. She received pain medication this morning around 7am. Pain is 11/10 right now. Denies nausea/vomiting.  ROS : Review of Systems  Gastrointestinal:  Positive for abdominal pain. Negative for blood in stool, constipation, diarrhea, heartburn, melena, nausea and vomiting.  Genitourinary:  Negative for dysuria and urgency.  Musculoskeletal:  Positive for back pain.     Objective: Vital signs in last 24 hours: Vitals:   01/29/21 0204 01/29/21 0636  BP: (!) 144/77 (!) 149/75  Pulse: 94 89  Resp: 18 18  Temp: 97.8 F (36.6 C) 98 F (36.7 C)  SpO2: 99% 100%    Physical Exam:  General:  Alert, cooperative, no distress, appears stated age  Head:  Normocephalic, without obvious abnormality, atraumatic  Eyes:  Anicteric sclera, EOM's intact  Lungs:   Clear to auscultation bilaterally, respirations unlabored  Heart:  Regular rate and rhythm, S1, S2 normal  Abdomen:   Soft, epigastric tenderness, bowel sounds active all four quadrants,  no masses,     Lab Results: Recent Labs    01/27/21 1843 01/28/21 0608  NA 135 136  K 4.3 4.1  CL 100 105  CO2 27 25  GLUCOSE 122* 123*  BUN 25* 23  CREATININE 0.77 0.64  CALCIUM 9.4 8.6*   Recent Labs    01/27/21 1843 01/28/21 0608  AST 20 19  ALT 24 22  ALKPHOS 84 76  BILITOT 0.6 0.5  PROT 6.8 6.4*  ALBUMIN 3.8 3.4*   Recent Labs    01/27/21 1843 01/28/21 0608 01/28/21 1104 01/29/21 0502  WBC 10.7*   < > 8.3 7.9  NEUTROABS 7.4  --   --   --   HGB 12.7   < > 11.1* 10.9*  HCT 37.9   < > 34.2* 34.1*  MCV 80.6   < > 82.2 82.2  PLT 229   < > 180 183   < > = values in this interval not displayed.   Recent Labs    01/28/21 0130  LABPROT 13.2  INR 1.0      Assessment Abdominal pain, MRI concerning for pancreatic adenocarcinoma with  metastatic disease - hgb 10.9 - MRI 1/17: 2.5 x 2,1 cm lesion in tail of pancreas, lymph nodes in hepatoduodenal ligament, 1.7 cm subscapular lesion concerning for metastatic disease, 1.4 cm lesion in lower thoracic spine   Plan: Oncology consult appreciated for evaluation of possible metastatic disease. Will obtain liver biopsy (scheduled for tomorrow). Continue supportive care [facility] will follow.  Garnette Scheuermann PA-C 01/29/2021, 9:24 AM  Contact #  [phone]

## 2021-01-29 NOTE — Progress Notes (Signed)
°  Transition of Care [facility]) Screening Note   Patient Details  Name: Gloria Ward Date of Birth: 1950/01/27   Transition of Care [facility]) CM/SW Contact:    Cato Liburd, Marjie Skiff, RN Phone Number: 01/29/2021, 2:48 PM    Transition of Care Department [facility]) has reviewed patient and no TOC needs have been identified at this time. We will continue to monitor patient advancement through interdisciplinary progression rounds. If new patient transition needs arise, please place a TOC consult.

## 2021-01-29 NOTE — Assessment & Plan Note (Addendum)
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

## 2021-01-30 ENCOUNTER — Inpatient Hospital Stay (HOSPITAL_COMMUNITY): Payer: 59

## 2021-01-30 DIAGNOSIS — Z515 Encounter for palliative care: Secondary | ICD-10-CM

## 2021-01-30 DIAGNOSIS — R52 Pain, unspecified: Secondary | ICD-10-CM

## 2021-01-30 DIAGNOSIS — Z7189 Other specified counseling: Secondary | ICD-10-CM

## 2021-01-30 DIAGNOSIS — D378 Neoplasm of uncertain behavior of other specified digestive organs: Secondary | ICD-10-CM | POA: Diagnosis not present

## 2021-01-30 DIAGNOSIS — I82890 Acute embolism and thrombosis of other specified veins: Secondary | ICD-10-CM | POA: Diagnosis not present

## 2021-01-30 DIAGNOSIS — I81 Portal vein thrombosis: Secondary | ICD-10-CM | POA: Diagnosis not present

## 2021-01-30 DIAGNOSIS — K8689 Other specified diseases of pancreas: Secondary | ICD-10-CM

## 2021-01-30 DIAGNOSIS — E039 Hypothyroidism, unspecified: Secondary | ICD-10-CM | POA: Diagnosis not present

## 2021-01-30 DIAGNOSIS — K769 Liver disease, unspecified: Secondary | ICD-10-CM

## 2021-01-30 LAB — BASIC METABOLIC PANEL
Anion gap: 6 (ref 5–15)
BUN: 15 mg/dL (ref 8–23)
CO2: 27 mmol/L (ref 22–32)
Calcium: 8.8 mg/dL — ABNORMAL LOW (ref 8.9–10.3)
Chloride: 101 mmol/L (ref 98–111)
Creatinine, Ser: 0.69 mg/dL (ref 0.44–1.00)
GFR, Estimated: >60 mL/min (ref >60)
Glucose, Bld: 133 mg/dL — ABNORMAL HIGH (ref 70–99)
Potassium: 4 mmol/L (ref 3.5–5.1)
Sodium: 134 mmol/L — ABNORMAL LOW (ref 135–145)

## 2021-01-30 LAB — T3: T3, Total: 98 ng/dL (ref 71–180)

## 2021-01-30 LAB — CBC
HCT: 32.2 % — ABNORMAL LOW (ref 36.0–46.0)
Hemoglobin: 10.5 g/dL — ABNORMAL LOW (ref 12.0–15.0)
MCH: 27.1 pg (ref 26.0–34.0)
MCHC: 32.6 g/dL (ref 30.0–36.0)
MCV: 83.2 fL (ref 80.0–100.0)
Platelets: 165 10*3/uL (ref 150–400)
RBC: 3.87 MIL/uL (ref 3.87–5.11)
RDW: 13.4 % (ref 11.5–15.5)
WBC: 7.9 10*3/uL (ref 4.0–10.5)
nRBC: 0 % (ref 0.0–0.2)

## 2021-01-30 LAB — MAGNESIUM: Magnesium: 2 mg/dL (ref 1.7–2.4)

## 2021-01-30 LAB — HEPARIN LEVEL (UNFRACTIONATED)
Heparin Unfractionated: 0.25 IU/mL — ABNORMAL LOW (ref 0.30–0.70)
Heparin Unfractionated: 0.52 IU/mL (ref 0.30–0.70)

## 2021-01-30 MED ORDER — FENTANYL CITRATE (PF) 100 MCG/2ML IJ SOLN
INTRAMUSCULAR | Status: AC | PRN
Start: 2021-01-30 — End: 2021-01-30
  Administered 2021-01-30: 50 ug via INTRAVENOUS

## 2021-01-30 MED ORDER — SODIUM CHLORIDE 0.9% FLUSH: 9.0000 mL | INTRAVENOUS | Status: DC | PRN

## 2021-01-30 MED ORDER — NALOXONE HCL 0.4 MG/ML IJ SOLN: 0.4000 mg | INTRAMUSCULAR | Status: DC | PRN

## 2021-01-30 MED ORDER — GELATIN ABSORBABLE 12-7 MM EX MISC
CUTANEOUS | Status: AC
  Administered 2021-01-30: 1
  Filled 2021-01-30: qty 1

## 2021-01-30 MED ORDER — MIDAZOLAM HCL 2 MG/2ML IJ SOLN
INTRAMUSCULAR | Status: AC | PRN
Start: 2021-01-30 — End: 2021-01-30
  Administered 2021-01-30: 1 mg via INTRAVENOUS

## 2021-01-30 MED ORDER — FENTANYL CITRATE (PF) 100 MCG/2ML IJ SOLN
INTRAMUSCULAR | Status: AC
  Filled 2021-01-30: qty 2

## 2021-01-30 MED ORDER — LIOTHYRONINE SODIUM 25 MCG PO TABS
12.5000 ug | ORAL_TABLET | Freq: Every day | ORAL | Status: DC
  Filled 2021-01-30: qty 1

## 2021-01-30 MED ORDER — LIOTHYRONINE SODIUM 25 MCG PO TABS: 12.5000 ug | ORAL_TABLET | Freq: Every day | ORAL | Status: DC

## 2021-01-30 MED ORDER — MIDAZOLAM HCL 2 MG/2ML IJ SOLN
INTRAMUSCULAR | Status: AC
  Filled 2021-01-30: qty 4

## 2021-01-30 MED ORDER — LIDOCAINE HCL (PF) 1 % IJ SOLN
INTRAMUSCULAR | Status: AC | PRN
  Administered 2021-01-30: 10 mL via INTRADERMAL

## 2021-01-30 MED ORDER — LIDOCAINE HCL 1 % IJ SOLN
INTRAMUSCULAR | Status: AC
  Administered 2021-01-30: 10 mL
  Filled 2021-01-30: qty 20

## 2021-01-30 MED ORDER — LEVOTHYROXINE SODIUM 50 MCG PO TABS
50.0000 ug | ORAL_TABLET | Freq: Every day | ORAL | Status: DC
  Administered 2021-01-30 – 2021-02-02 (×4): 50 ug via ORAL
  Filled 2021-01-30 (×5): qty 1

## 2021-01-30 MED ORDER — FENTANYL CITRATE (PF) 100 MCG/2ML IJ SOLN
INTRAMUSCULAR | Status: AC | PRN
  Administered 2021-01-30: 50 ug via INTRAVENOUS

## 2021-01-30 MED ORDER — LIOTHYRONINE SODIUM 25 MCG PO TABS
12.5000 ug | ORAL_TABLET | Freq: Every day | ORAL | Status: DC
  Administered 2021-01-30 – 2021-02-02 (×4): 12.5 ug via ORAL
  Filled 2021-01-30 (×5): qty 1

## 2021-01-30 MED ORDER — DIPHENHYDRAMINE HCL 50 MG/ML IJ SOLN: 12.5000 mg | Freq: Four times a day (QID) | INTRAMUSCULAR | Status: DC | PRN

## 2021-01-30 MED ORDER — DIPHENHYDRAMINE HCL 12.5 MG/5ML PO ELIX: 12.5000 mg | ORAL_SOLUTION | Freq: Four times a day (QID) | ORAL | Status: DC | PRN

## 2021-01-30 MED ORDER — LIOTHYRONINE SODIUM 25 MCG PO TABS
12.5000 ug | ORAL_TABLET | Freq: Every day | ORAL | Status: DC
  Administered 2021-01-31 – 2021-02-02 (×3): 12.5 ug via ORAL
  Filled 2021-01-30 (×5): qty 1

## 2021-01-30 MED ORDER — HYDROMORPHONE 1 MG/ML IV SOLN
INTRAVENOUS | Status: DC
  Administered 2021-01-30: 30 mg via INTRAVENOUS
  Administered 2021-01-30: 1.42 mg via INTRAVENOUS
  Administered 2021-01-30: 3.45 mg via INTRAVENOUS
  Administered 2021-01-31: 0.9 mg via INTRAVENOUS
  Administered 2021-01-31: 2 mg via INTRAVENOUS
  Administered 2021-01-31: 2.43 mg via INTRAVENOUS
  Administered 2021-01-31: 1.93 mg via INTRAVENOUS
  Administered 2021-01-31: 1.99 mg via INTRAVENOUS
  Administered 2021-01-31: 2.5 mg via INTRAVENOUS
  Administered 2021-02-01: 2.13 mg via INTRAVENOUS
  Administered 2021-02-01: 1.77 mg via INTRAVENOUS
  Administered 2021-02-01: 1.29 mg via INTRAVENOUS
  Administered 2021-02-01: 2.26 mg via INTRAVENOUS
  Administered 2021-02-01: 3.1 mg via INTRAVENOUS
  Administered 2021-02-01: 1.87 mg via INTRAVENOUS
  Administered 2021-02-02: 2.3 mg via INTRAVENOUS
  Administered 2021-02-02: 2.14 mg via INTRAVENOUS
  Administered 2021-02-02: 2.51 mg via INTRAVENOUS
  Filled 2021-01-30 (×2): qty 30

## 2021-01-30 MED ORDER — HEPARIN (PORCINE) 25000 UT/250ML-% IV SOLN
1350.0000 [IU]/h | INTRAVENOUS | Status: DC
  Administered 2021-01-31 – 2021-02-01 (×2): 1350 [IU]/h via INTRAVENOUS
  Filled 2021-01-30 (×3): qty 250

## 2021-01-30 NOTE — Progress Notes (Signed)
[place] for Heparin Indication: Splenic Vein Thrombus  Allergies  Allergen Reactions   Amlodipine Swelling   Tramadol Hcl Other (See Comments)   Trazodone Hcl Other (See Comments)   Penicillins Rash and Other (See Comments)    Rash and hot all over. Has tolerated Keflex    Patient Measurements: Height: 5' 2.5" (158.8 cm) Weight: 75.7 kg (166 lb 14.4 oz) IBW/kg (Calculated) : 51.25 Heparin Dosing Weight: 68 kg  Vital Signs: Temp: 97.9 F (36.6 C) (01/19 0505) Temp Source: Oral (01/18 2201) BP: 96/80 (01/19 0505) Pulse Rate: 96 (01/19 0505)  Labs: Recent Labs    01/27/21 1843 01/28/21 0130 01/28/21 0608 01/28/21 1104 01/29/21 0502 01/30/21 0027 01/30/21 0801  HGB 12.7  --  11.7* 11.1* 10.9* 10.5*  --   HCT 37.9  --  36.0 34.2* 34.1* 32.2*  --   PLT 229  --  209 180 183 165  --   APTT  --  24  --   --   --   --   --   LABPROT  --  13.2  --   --   --   --   --   INR  --  1.0  --   --   --   --   --   HEPARINUNFRC  --   --   --   --   --  0.25* 0.52  CREATININE 0.77  --  0.64  --   --  0.69  --      Estimated Creatinine Clearance: 64.9 mL/min (by C-G formula based on SCr of 0.69 mg/dL).   Medical History: Past Medical History:  Diagnosis Date   Anxiety    Heart murmur    no prob - no med   Hypertension    Hypothyroidism    Insomnia     Medications:  No oral anticoagulation PTA  Assessment: 71 yr female with abdominal pain s/p abdominal surgery October 2022.  CT of abdomen/pelvis = + splenic vein thrombus as well as pancreatic masses.  Noted patient had reported black stools previously during admit.  Pharmacy was consulted to dose heparin drip without a bolus.    1/17: Hemoccult positive, GI was consulted for GIB work-up, and heparin drip held.  However, since hemoglobin is stable and concern for metastases, GI will not be pursuing further work-up at this time.  Discussed with IR - liver biopsy planned for 1/19 and  okay to resume heparin drip in the interim.  IR to advise when heparin drip should be held.    Today, 01/30/21 -Heparin level 0.52 (therapeutic) on infusion at 1250 units/hr -Hemoglobin essentially stable at 10.5, platelets wnl -Per RN report: no BM since yesterday, infusion rate confirmed  Goal of Therapy:  Heparin level 0.3-0.7 units/ml Monitor platelets by anticoagulation protocol: Yes   Plan:  Remains on heparin 1250 units/hr at present IR to advise when to hold heparin drip today prior to liver biopsy  Heparin level, CBC daily Monitor for further blood in stools  Clayburn Pert, PharmD, [place] [phone] 01/30/2021  8:57 AM

## 2021-01-30 NOTE — Progress Notes (Signed)
[place] for Heparin Indication: Splenic Vein Thrombus  Allergies  Allergen Reactions   Amlodipine Swelling   Tramadol Hcl Other (See Comments)   Trazodone Hcl Other (See Comments)   Penicillins Rash and Other (See Comments)    Rash and hot all over. Has tolerated Keflex    Patient Measurements: Height: 5' 2.5" (158.8 cm) Weight: 75.7 kg (166 lb 14.4 oz) IBW/kg (Calculated) : 51.25 Heparin Dosing Weight: 68 kg  Vital Signs: Temp: 98.7 F (37.1 C) (01/18 2201) Temp Source: Oral (01/18 2201) BP: 163/88 (01/18 2201) Pulse Rate: 102 (01/18 2201)  Labs: Recent Labs    01/27/21 1843 01/28/21 0130 01/28/21 0608 01/28/21 1104 01/29/21 0502 01/30/21 0027  HGB 12.7  --  11.7* 11.1* 10.9* 10.5*  HCT 37.9  --  36.0 34.2* 34.1* 32.2*  PLT 229  --  209 180 183 165  APTT  --  24  --   --   --   --   LABPROT  --  13.2  --   --   --   --   INR  --  1.0  --   --   --   --   HEPARINUNFRC  --   --   --   --   --  0.25*  CREATININE 0.77  --  0.64  --   --  0.69     Estimated Creatinine Clearance: 64.9 mL/min (by C-G formula based on SCr of 0.69 mg/dL).   Medical History: Past Medical History:  Diagnosis Date   Anxiety    Heart murmur    no prob - no med   Hypertension    Hypothyroidism    Insomnia     Medications:  No oral anticoagulation PTA  Assessment: 71 yr female with abdominal pain s/p abdominal surgery October 2022.  CT of abdomen/pelvis = + splenic vein thrombus as well as pancreatic masses.  Noted patient had reported black stools previously during admit.  Pharmacy was consulted to dose heparin drip without a bolus.    1/17: Hemoccult positive, GI was consulted for GIB work-up, and heparin drip held.  However, since hemoglobin is stable and concern for metastases, GI will not be pursuing further work-up at this time.  Discussed with IR - liver biopsy planned for 1/19 and okay to resume heparin drip in the interim.  IR to  advise when heparin drip should be held.   Baseline labs 1/17: aPTT 24, INR 1  Today, 01/30/21 - Heparin level = 0.25 (subtherapeutic) with heparin gtt @ 1100 units/hr  -Hemoglobin stable at 10.5, platelets wnl - No further bloody stools noted; no other complications of therapy noted  Goal of Therapy:  Heparin level 0.3-0.7 units/ml Monitor platelets by anticoagulation protocol: Yes   Plan:  Increase heparin gtt to 1250 units/hr  Check heparin level 6 hr after heparin rate increased Daily heparin level & CBC Monitor for further blood in stool IR to advise when to hold heparin drip prior to liver biopsy 1/19  Jenavee Laguardia, Toribio Harbour, PharmD 01/30/2021,1:20 AM

## 2021-01-30 NOTE — Consult Note (Signed)
Palliative Care Consult Note                                  Date: 01/30/2021   Patient Name: Gloria Ward  DOB: [birth year]  MRN: [id number]  Age / Sex: 71 y.o., female  PCP: Lawerance Cruel, MD Referring Physician: Dwyane Dee, MD  Reason for Consultation: Establishing goals of care and Pain control  HPI/Patient Profile: 71 y.o. female  with past medical history of anxiety, HTN, Hashimoto's thyroiditis who presented to the hospital with approximately 2-week complaint of intermittent abdominal pain also radiating to her back.  She was also having black tarry stools at home.  She endorsed taking Aleve at essentially max dose for approximately 4 weeks prior to admission and was also using Norco for pain control.  CT abdomen/pelvis on admission showed a splenic vein thrombus and pancreatic mass with recommendation for MRI to further correlate.  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  She was admitted for GI evaluation and further work-up regarding underlying lesions.  PMT was consulted for ongoing [place] discussions and pain control.  Past Medical History:  Diagnosis Date   Anxiety    Heart murmur    no prob - no med   Hypertension    Hypothyroidism    Insomnia     Subjective:   This NP Walden Field reviewed medical records, received report from team, assessed the patient and then meet at the patient's bedside to discuss diagnosis, prognosis, GOC, EOL wishes disposition and options.  I met with the patient at the bedside.   Concept of Palliative Care was introduced as specialized medical care for people and their families living with serious illness.  If focuses on providing relief from the  symptoms and stress of a serious illness.  The goal is to improve quality of life for both the patient and the family. Values and goals of care important to patient and family were attempted to be elicited.  Created space and opportunity for patient  and family to explore thoughts and feelings regarding current medical situation   Natural trajectory and current clinical status were discussed. Questions and concerns addressed. Patient  encouraged to call with questions or concerns.    Patient/Family Understanding of Illness: Patient she has been a Marine scientist for about 21 years.  She states "I know what this is going" and notes that her imaging showed lesions in various places including 2 in the pancreas, one of the thoracic spine, and the splenic artery clot for which she is on heparin.  We discussed that many things stated in the body such as this and that it appears to be important to her cancer, although we will wait for biopsy for confirmation.  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  For the past 10 years she has worked through [employer] for [employer], [employer]/cl[employer].  She was born in [place] but grew up in [place].  She has 2 dogs: 70 poodle pitbull mix rescue.  She has 1 daughter Scientist, water quality) MSN.  She enjoys biking, yard work.  She also has started learning about Tarot Cards.  Patient Values: She seems to value independence  Goals: To be determined after biopsy  Today's Discussion: We had a good discussion about her current clinical situation, pending biopsy today by interventional radiology but all things seem to point toward new diagnosis of pancreatic cancer with metastasis.  She seems understand the situation she is in but appears to want confirmation.  We discussed more about her pain.  She states her pain is little better today but most of her pain is actually in her  "butt" and sciatic area, although she denies any history of sciatica.  She is not having numbness, tingling, or shooting pain down her legs.  Sometimes positioning when specific "decreased in the bed" can help.  Her abdominal pain is kind of vague and generalized.  Her abdominal pain is 9/10 at its worst but with meds and medication control it can get as good as 3-4/10.  Her goal for her pain is 3/10.  We discussed and agreed that it is generally not possible to remove all pain but we will make an attempt to get her to to her pain goal.  I provided emotional and general support through therapeutic listening, empathy, sharing of stories and memories, and other techniques. I answered all questions and addressed all concerns to the best of my ability.  Review of Systems  Constitutional:  Negative for appetite change and fatigue.  Respiratory:  Negative for cough, chest tightness and shortness of breath.   Cardiovascular:  Negative for chest pain.  Gastrointestinal:  Positive for abdominal pain. Negative for vomiting.       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  Constitutional:      General: She is not in acute distress. HENT:     Head: Normocephalic and atraumatic.  Cardiovascular:     Rate and Rhythm: Normal rate.     Heart sounds: No murmur heard. Pulmonary:     Effort: Pulmonary effort is normal. No respiratory distress.     Breath sounds: No wheezing or rhonchi.  Abdominal:     General: Abdomen is flat.     Palpations: Abdomen is soft.     Tenderness: There is no abdominal tenderness (denies TTP).  Skin:    General: Skin is warm and dry.  Neurological:     General: No focal deficit present.     Mental Status: She is alert.  Psychiatric:        Mood and  Affect: Mood normal.        Behavior: Behavior normal.    Vital Signs:  BP 96/80    Pulse 96    Temp 97.9 F (36.6 C)    Resp 16    Ht 5' 2.5" (1.588 m)    Wt 75.7 kg    LMP 06/30/2011    SpO2 96%    BMI 30.04 kg/m   Palliative Assessment/Data: 80%    Advanced Care Planning:   Primary Decision Maker: PATIENT  Code Status/Advance Care Planning: Full code  Decisions/Changes to ACP: No changes today, pending biopsy and biopsy results  Assessment & Plan:   Impression: 71 year old female admitted with abdominal pain and imaging suggests likely newly diagnosed metastatic pancreatic cancer.  She is planning to undergo biopsy this afternoon of liver  lesions for confirmation.  She is having significant pain primarily in the abdomen and buttock/sciatic area (possible nerve root compression from thoracic region?).  He is working pain management and PM&R with goals of care discussions which can be further s/p results.  SUMMARY OF RECOMMENDATIONS   Pain management via PCA as per below Await further work-up Plan for ongoing goals of care discussion as results return Would likely benefit from referral to see [facility] palliative symptom management clinic PMT will continue to follow  Symptom Management:  Discontinue as needed Dilaudid pushes Begin Dilaudid PCA: 0.5 mg basal rate, 0.3 mg every 15 minutes demand dosing.  Max total hourly 1.7 mg.  Prognosis:  Unable to determine  Discharge Planning:  To Be Determined   Discussed with: Medical team, nursing team, patient    Thank you for allowing [name] to participate in the care of ARTHELIA CALLICOTT PMT will continue to support holistically.  Time Total: 75 min  Greater than 50%  of this time was spent counseling and coordinating care related to the above assessment and plan.  Signed by: Walden Field, NP Palliative Medicine Team  Team Phone # [phone] (Nights/Weekends)  01/30/2021, 10:54 AM

## 2021-01-30 NOTE — Progress Notes (Signed)
[facility] for Heparin Indication: Splenic Vein Thrombus  Allergies  Allergen Reactions   Amlodipine Swelling   Tramadol Hcl Other (See Comments)   Trazodone Hcl Other (See Comments)   Penicillins Rash and Other (See Comments)    Rash and hot all over. Has tolerated Keflex    Patient Measurements: Height: 5' 2.5" (158.8 cm) Weight: 75.7 kg (166 lb 14.4 oz) IBW/kg (Calculated) : 51.25 Heparin Dosing Weight: 68 kg  Vital Signs: Temp: 97.9 F (36.6 C) (01/19 0505) BP: 120/78 (01/19 1355) Pulse Rate: 80 (01/19 1350)  Labs: Recent Labs    01/27/21 1843 01/28/21 0130 01/28/21 0608 01/28/21 1104 01/29/21 0502 01/30/21 0027 01/30/21 0801  HGB 12.7  --  11.7* 11.1* 10.9* 10.5*  --   HCT 37.9  --  36.0 34.2* 34.1* 32.2*  --   PLT 229  --  209 180 183 165  --   APTT  --  24  --   --   --   --   --   LABPROT  --  13.2  --   --   --   --   --   INR  --  1.0  --   --   --   --   --   HEPARINUNFRC  --   --   --   --   --  0.25* 0.52  CREATININE 0.77  --  0.64  --   --  0.69  --      Estimated Creatinine Clearance: 64.9 mL/min (by C-G formula based on SCr of 0.69 mg/dL).   Medical History: Past Medical History:  Diagnosis Date   Anxiety    Heart murmur    no prob - no med   Hypertension    Hypothyroidism    Insomnia     Medications:  No oral anticoagulation PTA  Assessment: 71 yr female with abdominal pain s/p abdominal surgery October 2022.  CT of abdomen/pelvis = + splenic vein thrombus as well as pancreatic masses.  Noted patient had reported black stools previously during admit.  Pharmacy was consulted to dose heparin drip without a bolus.    1/17: Hemoccult positive, GI was consulted for GIB work-up, and heparin drip held.  However, since hemoglobin is stable and concern for metastases, GI will not be pursuing further work-up at this time.  Discussed with IR - liver biopsy planned for 1/19 and okay to resume heparin drip in  the interim.  IR to advise when heparin drip should be held.    Today, 01/30/21 - Heparin level 0.52 (therapeutic) on infusion at 1250 units/hr this AM - Hemoglobin essentially stable at 10.5, platelets wnl - Heparin drip held this AM @ 1000 for liver biopsy - Per IR, resume heparin drip 6 hours after procedure  Goal of Therapy:  Heparin level 0.3-0.7 units/ml Monitor platelets by anticoagulation protocol: Yes   Plan:  Resume heparin drip at 1250 units/hr @ 2000 this evening per IR Heparin level, CBC daily Monitor for further blood in stools F/u ability to transition to an oral anticoagulant  Dimple Nanas, PharmD 01/30/2021 2:55 PM

## 2021-01-30 NOTE — Progress Notes (Addendum)
[facility] Gastroenterology Progress Note  Gloria Ward 71 y.o. October 10, 1950  CC:  Abdominal pain, pancreatic mass   Subjective: Patient states her pain is more well controlled today. States she feels she has an "upset stomach" which is why she has some epigastric discomfort. This is typically controlled with protonix for her. Had a dark BM yesterday, has not had one since. She is taking dulcolax PRN. Denies nausea/vomiting.  ROS : Review of Systems  Gastrointestinal:  Positive for abdominal pain. Negative for blood in stool, constipation, diarrhea, heartburn, melena, nausea and vomiting.  Genitourinary:  Negative for dysuria and urgency.     Objective: Vital signs in last 24 hours: Vitals:   01/29/21 2201 01/30/21 0505  BP: (!) 163/88 96/80  Pulse: (!) 102 96  Resp: 16 16  Temp: 98.7 F (37.1 C) 97.9 F (36.6 C)  SpO2: 98% 96%    Physical Exam:  General:  Alert, cooperative, no distress, appears stated age  Head:  Normocephalic, without obvious abnormality, atraumatic  Eyes:  Anicteric sclera, EOM's intact  Lungs:   Clear to auscultation bilaterally, respirations unlabored  Heart:  Regular rate and rhythm, S1, S2 normal  Abdomen:   Soft, epigastric tenderness, guarding, bowel sounds active all four quadrants,  no masses,     Lab Results: Recent Labs    01/28/21 0608 01/30/21 0027  NA 136 134*  K 4.1 4.0  CL 105 101  CO2 25 27  GLUCOSE 123* 133*  BUN 23 15  CREATININE 0.64 0.69  CALCIUM 8.6* 8.8*  MG  --  2.0   Recent Labs    01/27/21 1843 01/28/21 0608  AST 20 19  ALT 24 22  ALKPHOS 84 76  BILITOT 0.6 0.5  PROT 6.8 6.4*  ALBUMIN 3.8 3.4*   Recent Labs    01/27/21 1843 01/28/21 0608 01/29/21 0502 01/30/21 0027  WBC 10.7*   < > 7.9 7.9  NEUTROABS 7.4  --   --   --   HGB 12.7   < > 10.9* 10.5*  HCT 37.9   < > 34.1* 32.2*  MCV 80.6   < > 82.2 83.2  PLT 229   < > 183 165   < > = values in this interval not displayed.   Recent Labs    01/28/21 0130   LABPROT 13.2  INR 1.0      Assessment Pancreatic tail mass: liver lesion as well as lesion at T10 concerning for metastatic pancreatic cancer.  hgb 10.5 - MRI 1/17: 2.5 x 2,1 cm lesion in tail of pancreas, lymph nodes in hepatoduodenal ligament, 1.7 cm subscapular lesion concerning for metastatic disease, 1.4 cm lesion in lower thoracic spine - CA 19-9: 3,609   Splenic vein thrombosis  Melena  Abdominal pain - Protonix BID  Plan: Scheduled for Liver biopsy with IR today at 2:00. Appreciate oncology consult, they have seen patient and will see again after biopsy results. EUS only if liver biopsy does not confirm diagnosis Continue IV protonix 40mg  BID [facility] will follow  Garnette Scheuermann PA-C 01/30/2021, 9:48 AM  Contact #  [phone]

## 2021-01-30 NOTE — Progress Notes (Signed)
Progress Note    Gloria Ward   IZT:[id number]  DOB: [birth year]  DOA: 01/27/2021     2 PCP: Lawerance Cruel, MD  Initial CC: abdominal and back pain, dark stools  Hospital Course: Gloria Ward is a 71 yo female with PMH anxiety, HTN, Hashimoto's thyroiditis who presented to the hospital with approximately 2-week complaint of intermittent abdominal pain also radiating to her back.  She was also having black tarry stools at home.  She endorsed taking Aleve at essentially max dose for approximately 4 weeks prior to admission and was also using Norco for pain control. CT abdomen/pelvis on admission showed a splenic vein thrombus and pancreatic mass with recommendation for MRI to further correlate. 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 She was admitted for GI evaluation and further work-up regarding underlying lesions.  Interval History:  No events overnight.  Pain more controlled today after Dilaudid dose yesterday. She is undergoing liver biopsy today.  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  Patient wishing for biopsy results prior to further discussion with oncology  Abdominal pain - Patient presents with acute onset of abdominal pain.  Multiple possible etiologies including underlying lesion involving the tail of pancreas; patient also has been on daily use of NSAIDs with melanotic stools with risk for  ulceration/GIB -Palliative care also consulted on admission for assistance with pain control and possible further GOC discussions pending underlying malignancy work-up  GIB (gastrointestinal bleeding) - risk for NSAID induced erosions/ulcerations; patient endorses using Aleve at home for approximately 4 weeks; melanotic stools reported on admission  -GI consulted on admission; currently monitoring Hgb -Continue trending H/H and will transfuse if further drops but so far remaining stable - continue PPI for now   Hypothyroidism - Patient endorses history of Hashimoto's thyroiditis, managed by her primary care outpatient.  Unable to review any labs - Patient reports self adjustment of Cytomel at home.  States that she has not tolerated T4 replacement in the past -Discussed further with patient today.  Thyroid studies have returned and I reviewed them with her as well as discussed with endocrinology -At this time we will start low-dose levothyroxine and continue Cytomel at lower doses.  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  Likely related to underlying malignancy work-up - Continue heparin drip and will need to be transitioned to oral anticoagulation prior to discharge; will discuss with oncology at that time regarding preference -If bleeding or hemoglobin drop, will need to discontinue heparin drip    Old records reviewed in assessment of this patient  Antimicrobials:   DVT prophylaxis: Heparin drip  Code Status:   Code Status: Full Code  Disposition Plan: Home Status is: Inpatient  Objective: Blood pressure 120/78, pulse 80, temperature 97.9 F (36.6 C), resp. rate 13, height 5' 2.5" (1.588 m),  weight 75.7 kg, last menstrual period 06/30/2011, SpO2 99 %.  Examination:  Physical  Exam Constitutional:      Appearance: Normal appearance.  HENT:     Head: Normocephalic and atraumatic.     Mouth/Throat:     Mouth: Mucous membranes are moist.  Eyes:     Extraocular Movements: Extraocular movements intact.  Cardiovascular:     Rate and Rhythm: Normal rate and regular rhythm.  Pulmonary:     Effort: Pulmonary effort is normal.     Breath sounds: Normal breath sounds.  Abdominal:     General: Bowel sounds are normal. There is no distension.     Palpations: Abdomen is soft.     Tenderness: There is abdominal tenderness.  Musculoskeletal:        General: Normal range of motion.     Cervical back: Normal range of motion and neck supple.  Skin:    General: Skin is warm and dry.  Neurological:     General: No focal deficit present.     Mental Status: She is alert.  Psychiatric:        Mood and Affect: Mood normal.        Behavior: Behavior normal.     Consultants:  GI Palliative care Oncology IR  Procedures:  Liver biopsy, 01/30/2021  Data Reviewed: I have personally reviewed labs and imaging studies    LOS: 2 days  Time spent: Greater than 50% of the 35 minute visit was spent in counseling/coordination of care for the patient as laid out in the A&P.   Dwyane Dee, MD [facility] 01/30/2021, 5:37 PM

## 2021-01-30 NOTE — Procedures (Signed)
Interventional Radiology Procedure:   Indications: Pancreatic mass with liver lesion   Procedure: US guided core biopsy of right hepatic lesion   Findings: 3 cores obtained from right hepatic lobe.    Complications: No immediate complications noted.     EBL: Minimal  Plan: Bedrest 3 hours. Plan to restart heparin in 6 hours   Lauren Modisette R. Anselm Pancoast, MD  Pager: [phone]

## 2021-01-31 ENCOUNTER — Inpatient Hospital Stay (HOSPITAL_COMMUNITY): Payer: 59

## 2021-01-31 DIAGNOSIS — K922 Gastrointestinal hemorrhage, unspecified: Secondary | ICD-10-CM | POA: Diagnosis not present

## 2021-01-31 DIAGNOSIS — G893 Neoplasm related pain (acute) (chronic): Secondary | ICD-10-CM | POA: Diagnosis not present

## 2021-01-31 DIAGNOSIS — Z7189 Other specified counseling: Secondary | ICD-10-CM | POA: Diagnosis not present

## 2021-01-31 DIAGNOSIS — D378 Neoplasm of uncertain behavior of other specified digestive organs: Secondary | ICD-10-CM | POA: Diagnosis not present

## 2021-01-31 DIAGNOSIS — Z515 Encounter for palliative care: Secondary | ICD-10-CM | POA: Diagnosis not present

## 2021-01-31 DIAGNOSIS — I82890 Acute embolism and thrombosis of other specified veins: Secondary | ICD-10-CM | POA: Diagnosis not present

## 2021-01-31 DIAGNOSIS — I81 Portal vein thrombosis: Secondary | ICD-10-CM | POA: Diagnosis not present

## 2021-01-31 LAB — CBC
HCT: 33.4 % — ABNORMAL LOW (ref 36.0–46.0)
Hemoglobin: 10.7 g/dL — ABNORMAL LOW (ref 12.0–15.0)
MCH: 26.8 pg (ref 26.0–34.0)
MCHC: 32 g/dL (ref 30.0–36.0)
MCV: 83.7 fL (ref 80.0–100.0)
Platelets: 154 10*3/uL (ref 150–400)
RBC: 3.99 MIL/uL (ref 3.87–5.11)
RDW: 13.2 % (ref 11.5–15.5)
WBC: 9.8 10*3/uL (ref 4.0–10.5)
nRBC: 0 % (ref 0.0–0.2)

## 2021-01-31 LAB — MAGNESIUM: Magnesium: 2.1 mg/dL (ref 1.7–2.4)

## 2021-01-31 LAB — BASIC METABOLIC PANEL
Anion gap: 8 (ref 5–15)
BUN: 13 mg/dL (ref 8–23)
CO2: 26 mmol/L (ref 22–32)
Calcium: 8.8 mg/dL — ABNORMAL LOW (ref 8.9–10.3)
Chloride: 98 mmol/L (ref 98–111)
Creatinine, Ser: 0.77 mg/dL (ref 0.44–1.00)
GFR, Estimated: >60 mL/min (ref >60)
Glucose, Bld: 143 mg/dL — ABNORMAL HIGH (ref 70–99)
Potassium: 4.2 mmol/L (ref 3.5–5.1)
Sodium: 132 mmol/L — ABNORMAL LOW (ref 135–145)

## 2021-01-31 LAB — HEPARIN LEVEL (UNFRACTIONATED)
Heparin Unfractionated: 0.2 IU/mL — ABNORMAL LOW (ref 0.30–0.70)
Heparin Unfractionated: 0.65 IU/mL (ref 0.30–0.70)
Heparin Unfractionated: 0.67 IU/mL (ref 0.30–0.70)

## 2021-01-31 MED ORDER — ONDANSETRON HCL 4 MG/2ML IJ SOLN
4.0000 mg | INTRAMUSCULAR | Status: DC | PRN
Start: 2021-01-31 — End: 2021-02-06
  Administered 2021-02-01 – 2021-02-06 (×9): 4 mg via INTRAVENOUS
  Filled 2021-01-31 (×10): qty 2

## 2021-01-31 MED ORDER — ONDANSETRON HCL 4 MG PO TABS
4.0000 mg | ORAL_TABLET | ORAL | Status: DC | PRN
  Administered 2021-02-06: 4 mg via ORAL
  Filled 2021-01-31: qty 1

## 2021-01-31 NOTE — Progress Notes (Signed)
Progress Note    Gloria Ward   NTI:[id number]  DOB: 1950/06/11  DOA: 01/27/2021     3 PCP: Gloria Cruel, MD  Initial CC: abdominal and back pain, dark stools  Hospital Course: [name] is a 71 yo female with PMH anxiety, HTN, Hashimoto's thyroiditis who presented to the hospital with approximately 2-week complaint of intermittent abdominal pain also radiating to her back.  She was also having black tarry stools at home.  She endorsed taking Aleve at essentially max dose for approximately 4 weeks prior to admission and was also using Norco for pain control. CT abdomen/pelvis on admission showed a splenic vein thrombus and pancreatic mass with recommendation for MRI to further correlate. 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 She was admitted for GI evaluation and further work-up regarding underlying lesions.  Interval History:  No events overnight.  Has been started on Dilaudid PCA by palliative care yesterday.  Pain remains controlled and she was comfortably resting in bed this morning.  We reviewed plan for essentially awaiting liver biopsy results while hospitalized in case of need for GI intervention to perform endoscopic ultrasound. She was also started on Synthroid yesterday with no issues so far.  She had no other concerns today.  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  Multiple possible etiologies including underlying lesion involving the tail of pancreas; patient also has been on daily use of NSAIDs with melanotic stools with risk for ulceration/GIB -Palliative care also consulted on admission for assistance with pain control and possible further GOC discussions pending underlying malignancy work-up  GIB (gastrointestinal bleeding) - risk for NSAID induced erosions/ulcerations; patient endorses using Aleve at home for approximately 4 weeks; melanotic stools reported on admission  -GI consulted on admission; currently monitoring Hgb -Continue trending H/H and will transfuse if further drops but so far remaining stable - continue PPI for now   Hypothyroidism - Patient endorses history of Hashimoto's thyroiditis, managed by her primary care outpatient.  Unable to review any labs - Patient reports self adjustment of Cytomel at home.  States that she has not tolerated T4 replacement in the past -Discussed further with patient today.  Thyroid studies have returned and I reviewed them with her as well as discussed with endocrinology -At this time we will start low-dose levothyroxine and continue Cytomel at lower doses.  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  Likely related to underlying malignancy work-up - Continue heparin drip and will need to be transitioned to oral anticoagulation prior to discharge; TOC consult to see if Eliquis vs Xarelto cheaper for patient  -If bleeding or hemoglobin drop, will need to discontinue heparin drip  Essential hypertension - BP more controlled  -  Use labetalol or hydralazine as needed    Old records reviewed in assessment of this patient  Antimicrobials:   DVT prophylaxis:  Heparin drip  Code Status:   Code Status: Full Code  Disposition Plan: Home Status is: Inpatient  Objective: Blood pressure (!) 153/77, pulse 80, temperature 98.3 F (36.8 C), temperature source Oral, resp. rate 18, height 5' 2.5" (1.588 m), weight 75.7 kg, last menstrual period 06/30/2011, SpO2 94 %.  Examination:  Physical Exam Constitutional:      Appearance: Normal appearance.  HENT:     Head: Normocephalic and atraumatic.     Mouth/Throat:     Mouth: Mucous membranes are moist.  Eyes:     Extraocular Movements: Extraocular movements intact.  Cardiovascular:     Rate and Rhythm: Normal rate and regular rhythm.  Pulmonary:     Effort: Pulmonary effort is normal.     Breath sounds: Normal breath sounds.  Abdominal:     General: Bowel sounds are normal. There is no distension.     Palpations: Abdomen is soft.     Tenderness: There is abdominal tenderness.  Musculoskeletal:        General: Normal range of motion.     Cervical back: Normal range of motion and neck supple.  Skin:    General: Skin is warm and dry.  Neurological:     General: No focal deficit present.     Mental Status: She is alert.  Psychiatric:        Mood and Affect: Mood normal.        Behavior: Behavior normal.     Consultants:  GI Palliative care Oncology IR  Procedures:  Liver biopsy, 01/30/2021  Data Reviewed: I have personally reviewed labs and imaging studies    LOS: 3 days  Time spent: Greater than 50% of the 35 minute visit was spent in counseling/coordination of care for the patient as laid out in the A&P.   Dwyane Dee, MD [facility] 01/31/2021, 2:38 PM

## 2021-01-31 NOTE — Progress Notes (Signed)
Oncology Discharge Planning Admission Note  [facility] at [facility] Address: [address], [place], [place] [address] Hours of Operation:  8am - 5pm, Monday - Friday  Clinic Contact Information:  [phone]  Oncology Care Team: Medical Oncologist:  Dr. Chryl Heck  Dr. Chryl Heck is aware of this hospital admission dated 01/28/21 and has been assessing patient at the bedside. The cancer center will follow Gloria Ward inpatient care to assist with discharge planning as indicated by the oncologist.  We will reach out to closer to discharge date to arrange follow up care.  Disclaimer:  This [place] note does not imply a formal consult request has been made by the admitting attending for this admission or there will be an inpatient consult completed by oncology.  Please request oncology consults as per standard process as indicated.

## 2021-01-31 NOTE — Progress Notes (Signed)
Pt Oxygen sats in the mid 80s per continuous oxygen monitor. Started on oxygen 2 liters and increased to 98% will continue  to monitor.

## 2021-01-31 NOTE — Progress Notes (Signed)
[place] for Heparin Indication: Splenic Vein Thrombus  Allergies  Allergen Reactions   Amlodipine Swelling   Tramadol Hcl Other (See Comments)   Trazodone Hcl Other (See Comments)   Penicillins Rash and Other (See Comments)    Rash and hot all over. Has tolerated Keflex    Patient Measurements: Height: 5' 2.5" (158.8 cm) Weight: 75.7 kg (166 lb 14.4 oz) IBW/kg (Calculated) : 51.25 Heparin Dosing Weight: 68 kg  Vital Signs: Temp: 98.3 F (36.8 C) (01/20 0428) Temp Source: Oral (01/20 0428) BP: 153/77 (01/20 0428) Pulse Rate: 80 (01/20 0428)  Labs: Recent Labs    01/28/21 0608 01/28/21 1104 01/29/21 0502 01/30/21 0027 01/30/21 0801 01/31/21 0459  HGB 11.7*   < > 10.9* 10.5*  --  10.7*  HCT 36.0   < > 34.1* 32.2*  --  33.4*  PLT 209   < > 183 165  --  154  HEPARINUNFRC  --   --   --  0.25* 0.52 0.20*  CREATININE 0.64  --   --  0.69  --   --    < > = values in this interval not displayed.     Estimated Creatinine Clearance: 64.9 mL/min (by C-G formula based on SCr of 0.69 mg/dL).   Medical History: Past Medical History:  Diagnosis Date   Anxiety    Heart murmur    no prob - no med   Hypertension    Hypothyroidism    Insomnia     Medications:  No oral anticoagulation PTA  Assessment: 71 yr female with abdominal pain s/p abdominal surgery October 2022.  CT of abdomen/pelvis = + splenic vein thrombus as well as pancreatic masses.  Noted patient had reported black stools previously during admit.  Pharmacy was consulted to dose heparin drip without a bolus.    1/17: Hemoccult positive, GI was consulted for GIB work-up, and heparin drip held.  However, since hemoglobin is stable and concern for metastases, GI will not be pursuing further work-up at this time.  Discussed with IR - liver biopsy planned for 1/19 and okay to resume heparin drip in the interim.    1/19: Heparin held for IR- liver biopsy and resumed 6h post-  procedure  Today, 01/31/21 - Heparin level 0.2 (subtherapeutic) on infusion at 1250 units/hr this AM (1st after restart) - Hemoglobin low but stable at 10.7, platelets wnl but slowly trending down - No bleeding or infusion related issues per RN  Goal of Therapy:  Heparin level 0.3-0.7 units/ml Monitor platelets by anticoagulation protocol: Yes   Plan:  Increase heparin drip slightly to 1350 units/hr Recheck heparin level 8hr after rate increase Heparin level, CBC daily Monitor for further blood in stools F/u ability to transition to an oral anticoagulant  Netta Cedars, PharmD 01/31/2021 5:48 AM

## 2021-01-31 NOTE — Progress Notes (Signed)
Pharmacy Brief Note - Evening Anticoagulation Follow Up:  Patient is a 42 yoF on heparin drip for splenic vein thrombosis. For detailed history, see note by Netta Cedars, PharmD from earlier today.   Assessment: HL = 0.65 is now therapeutic on heparin infusion of 1350 units/hr Confirmed with RN that heparin infusing at correct rate. No issues with infusion. No signs of bleeding.   Goal: HL 0.3 - 0.7  Plan: Continue heparin drip at current rate of 1350 units/hr Check confirmatory 6 hour HL CBC, HL daily while on heparin infusion Monitor for signs of bleeding  Lenis Noon, PharmD 01/31/21 2:22 PM

## 2021-01-31 NOTE — Progress Notes (Signed)
[facility] Gastroenterology Progress Note  Gloria Ward 71 y.o. 08-11-1950  CC:  Abdominal pain, pancreatic mass   Subjective: Patient states she has been feeling nauseous. Unsure if it is related to pain medication or not as she is on a drip. States pain is currently well controlled at this time. Has been tolerating diet well.  ROS : Review of Systems  Gastrointestinal:  Positive for abdominal pain and nausea. Negative for blood in stool, constipation, diarrhea, heartburn, melena and vomiting.  Genitourinary:  Negative for dysuria and urgency.     Objective: Vital signs in last 24 hours: Vitals:   01/31/21 0428 01/31/21 0832  BP: (!) 153/77   Pulse: 80   Resp: 18 14  Temp: 98.3 F (36.8 C)   SpO2: 99% 98%    Physical Exam:  General:  Alert, cooperative, no distress, appears stated age  Head:  Normocephalic, without obvious abnormality, atraumatic  Eyes:  Anicteric sclera, EOM's intact  Lungs:   Clear to auscultation bilaterally, respirations unlabored  Heart:  Regular rate and rhythm, S1, S2 normal  Abdomen:   Generalized abdominal discomfort, bowel sounds active all four quadrants,  no masses,     Lab Results: Recent Labs    01/30/21 0027 01/31/21 0459  NA 134* 132*  K 4.0 4.2  CL 101 98  CO2 27 26  GLUCOSE 133* 143*  BUN 15 13  CREATININE 0.69 0.77  CALCIUM 8.8* 8.8*  MG 2.0 2.1   No results for input(s): AST, ALT, ALKPHOS, BILITOT, PROT, ALBUMIN in the last 72 hours. Recent Labs    01/30/21 0027 01/31/21 0459  WBC 7.9 9.8  HGB 10.5* 10.7*  HCT 32.2* 33.4*  MCV 83.2 83.7  PLT 165 154   No results for input(s): LABPROT, INR in the last 72 hours.    Assessment Pancreatic tail mass: liver lesion as well as lesion at T10 concerning for metastatic pancreatic cancer. - hgb 10.5 - MRI 1/17: 2.5 x 2,1 cm lesion in tail of pancreas, lymph nodes in hepatoduodenal ligament, 1.7 cm subscapular lesion concerning for metastatic disease, 1.4 cm lesion in lower  thoracic spine - CA 19-9: 3,609   Splenic vein thrombosis   Melena   Abdominal pain - Protonix BID   Plan: Liver biopsy still pending EUS only if liver biopsy does not confirm diagnosis IV protonix 40mg  twice a day Continue anti-emetics and supportive care as needed. [facility] will follow.  Garnette Scheuermann PA-C 01/31/2021, 9:09 AM  Contact #  [phone]

## 2021-01-31 NOTE — Progress Notes (Signed)
Daily Progress Note   Patient Name: Gloria Ward       Date: 01/31/2021 DOB: 12/10/1950  Age: 71 y.o. MRN#: [id number] Attending Physician: Dwyane Dee, MD Primary Care Physician: Lawerance Cruel, MD Admit Date: 01/27/2021 Length of Stay: 3 days  Reason for Consultation/Follow-up: Establishing goals of care and Pain control  HPI/Patient Profile:  71 y.o. female  with past medical history of anxiety, HTN, Hashimoto's thyroiditis who presented to the hospital with approximately 2-week complaint of intermittent abdominal pain also radiating to her back.  She was also having black tarry stools at home.  She endorsed taking Aleve at essentially max dose for approximately 4 weeks prior to admission and was also using Norco for pain control.   CT abdomen/pelvis on admission showed a splenic vein thrombus and pancreatic mass with recommendation for MRI to further correlate.   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   She was admitted for GI evaluation and further work-up regarding underlying lesions.   PMT was consulted for ongoing [place] discussions and pain control.  Subjective:   Subjective: Chart Reviewed. Updates received. Patient Assessed. Created space and opportunity for patient  and family to explore thoughts and feelings regarding current medical situation.  Today's Discussion: Today I met with the patient at the bedside.  She states her pain has been relatively well controlled.  Today her pain is between a 5 and 6.5, which she states it is tolerable.  She states she wants to be awake and alert and does not want to overdo the medication.  We discussed about the need to get ahead of the pain or becomes excruciating and she verbalized understanding.   She has had some nausea which she states Zofran helps.  She vomited last night.  I will increase her Neurontin to 4 hours, can consider other options if this is not effective enough.  Would generally avoid Phenergan due to ability to potentiate opioids given that she is opioid nave and starting on cancer pain management.  I provided emotional general support and therapeutic listening, empathy and other techniques.  Answered all questions and addressed all concerns to the best of my ability.  Interrogated her PCA.  24 hours she received 12.73 mg of Dilaudid CT results Trulance requesting bolus doses, 6 doses delivered.  Current basal rate 0.5 mg/h with a demand dose of 0.3 mg every 15 minutes as needed.  At this CAUTI day long rate she is essentially receiving the equivalent of her basal rate over the past 24-hours. No need for further adjustment at this time.  Review of Systems  Gastrointestinal:  Positive for abdominal pain ("tolerable"), nausea and vomiting (last night).   Objective:   Vital Signs:  BP (!) 152/76 (BP Location: Left Arm)    Pulse 91    Temp 98 F (36.7 C) (Oral)    Resp 14    Ht 5' 2.5" (1.588 m)    Wt 75.7 kg    LMP 06/30/2011    SpO2 99%    BMI 30.04 kg/m   Physical Exam: Physical Exam Vitals and nursing note reviewed.  Constitutional:      General: She is not in acute distress.    Appearance: Normal appearance.  HENT:     Head: Normocephalic and atraumatic.  Cardiovascular:  Rate and Rhythm: Normal rate.  Pulmonary:     Effort: Pulmonary effort is normal. No respiratory distress.     Breath sounds: No wheezing or rhonchi.  Abdominal:     General: Abdomen is flat.     Palpations: Abdomen is soft.  Skin:    General: Skin is warm and dry.  Neurological:     General: No focal deficit present.     Mental Status: She is alert.  Psychiatric:        Mood and Affect: Mood normal.        Behavior: Behavior normal.    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  She is planning to undergo biopsy this afternoon of liver lesions for confirmation.  She is having significant pain primarily in the abdomen and buttock/sciatic area (possible nerve root compression from thoracic region?).  He is working pain management and PM&R with goals of care discussions which can be further s/p results.  SUMMARY OF RECOMMENDATIONS   Pain management via PCA as per below - continue same dosing Await further work-up Plan for ongoing goals of care discussion as biopsy results return Plan follow-up with Palliative/[facility] symptom management clinic PMT will continue to follow  Symptom Management:  Continue Dilaudid PCA: Basal rate 0.5 mg/hr; demand dose 0.3 mg q 15 min prn Change Zofran to q 4 hours prn  Code Status: Full code  Prognosis: Unable to determine  Discharge Planning: To Be Determined  Discussed with: Medical team, nursing team, patient  Thank you for allowing [name] to participate in the care of Gloria Ward PMT will continue to support holistically.  MDM high: Acute or chronic illness posing threat to life or limb, discussion of management plan with other physicians/healthcare personnel (medical team, nursing team), ongoing management of parenteral opioid medications.  Walden Field, NP Palliative Medicine Team  Team Phone # [phone] (Nights/Weekends)  09/10/2020, 8:17 AM

## 2021-01-31 NOTE — Progress Notes (Signed)
Pharmacy Brief Note - Evening Anticoagulation Follow Up:  Patient is a 63 yoF on heparin drip for splenic vein thrombosis. For detailed history, see note by Netta Cedars, PharmD from earlier today.   Assessment: Confirmatory HL = 0.67 - remains therapeutic on heparin infusion  @ 1350 units/hr Confirmed with RN that heparin infusing at correct rate. No issues with infusion. No signs of bleeding.   Goal: HL 0.3 - 0.7  Plan: Continue heparin drip at current rate of 1350 units/hr CBC, HL daily while on heparin infusion Monitor for signs of bleeding  Eudelia Bunch, Pharm.D 01/31/2021 8:28 PM

## 2021-01-31 NOTE — Progress Notes (Signed)
Patient retaining urine per bladder scan of 450 mL. Doctor ordered in and out catheter.  Patient had 625 mL of output from in and out catheter.

## 2021-02-01 DIAGNOSIS — D378 Neoplasm of uncertain behavior of other specified digestive organs: Secondary | ICD-10-CM | POA: Diagnosis not present

## 2021-02-01 DIAGNOSIS — R1084 Generalized abdominal pain: Secondary | ICD-10-CM

## 2021-02-01 DIAGNOSIS — I81 Portal vein thrombosis: Secondary | ICD-10-CM | POA: Diagnosis not present

## 2021-02-01 LAB — CBC
HCT: 29.2 % — ABNORMAL LOW (ref 36.0–46.0)
Hemoglobin: 9.3 g/dL — ABNORMAL LOW (ref 12.0–15.0)
MCH: 26.9 pg (ref 26.0–34.0)
MCHC: 31.8 g/dL (ref 30.0–36.0)
MCV: 84.4 fL (ref 80.0–100.0)
Platelets: 137 10*3/uL — ABNORMAL LOW (ref 150–400)
RBC: 3.46 MIL/uL — ABNORMAL LOW (ref 3.87–5.11)
RDW: 13.4 % (ref 11.5–15.5)
WBC: 9.1 10*3/uL (ref 4.0–10.5)
nRBC: 0 % (ref 0.0–0.2)

## 2021-02-01 LAB — BASIC METABOLIC PANEL
Anion gap: 6 (ref 5–15)
BUN: 11 mg/dL (ref 8–23)
CO2: 28 mmol/L (ref 22–32)
Calcium: 8.7 mg/dL — ABNORMAL LOW (ref 8.9–10.3)
Chloride: 99 mmol/L (ref 98–111)
Creatinine, Ser: 0.62 mg/dL (ref 0.44–1.00)
GFR, Estimated: >60 mL/min (ref >60)
Glucose, Bld: 109 mg/dL — ABNORMAL HIGH (ref 70–99)
Potassium: 3.9 mmol/L (ref 3.5–5.1)
Sodium: 133 mmol/L — ABNORMAL LOW (ref 135–145)

## 2021-02-01 LAB — HEPARIN LEVEL (UNFRACTIONATED): Heparin Unfractionated: 0.63 IU/mL (ref 0.30–0.70)

## 2021-02-01 LAB — MAGNESIUM: Magnesium: 2.1 mg/dL (ref 1.7–2.4)

## 2021-02-01 MED ORDER — CHLORHEXIDINE GLUCONATE CLOTH 2 % EX PADS
6.0000 | MEDICATED_PAD | Freq: Every day | CUTANEOUS | Status: DC
  Administered 2021-02-01 – 2021-02-09 (×9): 6 via TOPICAL

## 2021-02-01 NOTE — Progress Notes (Signed)
[facility] for Heparin Indication: Splenic Vein Thrombus  Allergies  Allergen Reactions   Amlodipine Swelling   Tramadol Hcl Other (See Comments)   Trazodone Hcl Other (See Comments)   Penicillins Rash and Other (See Comments)    Rash and hot all over. Has tolerated Keflex    Patient Measurements: Height: 5' 2.5" (158.8 cm) Weight: 75.7 kg (166 lb 14.4 oz) IBW/kg (Calculated) : 51.25 Heparin Dosing Weight: 68 kg  Vital Signs: Temp: 97.7 F (36.5 C) (01/21 0432) Temp Source: Oral (01/21 0432) BP: 148/63 (01/21 0432) Pulse Rate: 95 (01/21 0432)  Labs: Recent Labs    01/30/21 0027 01/30/21 0801 01/31/21 0459 01/31/21 1348 01/31/21 2000 02/01/21 0539  HGB 10.5*  --  10.7*  --   --  9.3*  HCT 32.2*  --  33.4*  --   --  29.2*  PLT 165  --  154  --   --  137*  HEPARINUNFRC 0.25*   < > 0.20* 0.65 0.67 0.63  CREATININE 0.69  --  0.77  --   --  0.62   < > = values in this interval not displayed.     Estimated Creatinine Clearance: 64.9 mL/min (by C-G formula based on SCr of 0.62 mg/dL).   Medical History: Past Medical History:  Diagnosis Date   Anxiety    Heart murmur    no prob - no med   Hypertension    Hypothyroidism    Insomnia     Medications:  No oral anticoagulation PTA  Assessment: 71 yr female s/p abdominal surgery October 2022 presented with abdominal pain.  CT of abdomen/pelvis = + splenic vein thrombus as well as pancreatic masses.  Noted patient had reported black stools previously during admit.  Pharmacy was consulted to dose heparin drip without a bolus.    - 1/17: Hemoccult positive, GI was consulted for GIB work-up, and heparin drip held.  However, since hemoglobin is stable and concern for metastases, GI will not be pursuing further work-up at this time.  Discussed with IR - liver biopsy planned for 1/19 and okay to resume heparin drip in the interim.    - 1/19: Heparin held for IR- liver biopsy and  resumed 6h post- procedure  Today, 02/01/2021: - Heparin level is therapeutic at 0.63 - Hgb down 9.3, platelets down 137K - No bleeding documented  Goal of Therapy:  Heparin level 0.3-0.7 units/ml Monitor platelets by anticoagulation protocol: Yes   Plan:  - continue heparin drip at 1350 units/hr - Heparin level, CBC daily - Monitor for further blood in stools - F/u ability to transition to an oral anticoagulant  Dia Sitter, PharmD, BCPS 02/01/2021 7:53 AM

## 2021-02-01 NOTE — Progress Notes (Signed)
Daily Progress Note   Patient Name: Gloria Ward       Date: 02/01/2021 DOB: 14-Apr-1950  Age: 71 y.o. MRN#: [id number] Attending Physician: Dwyane Dee, MD Primary Care Physician: Lawerance Cruel, MD Admit Date: 01/27/2021 Length of Stay: 4 days  Reason for Consultation/Follow-up: Establishing goals of care and Pain control  HPI/Patient Profile:  71 y.o. female  with past medical history of anxiety, HTN, Hashimoto's thyroiditis who presented to the hospital with approximately 2-week complaint of intermittent abdominal pain also radiating to her back.  She was also having black tarry stools at home.  She endorsed taking Aleve at essentially max dose for approximately 4 weeks prior to admission and was also using Norco for pain control.   CT abdomen/pelvis on admission showed a splenic vein thrombus and pancreatic mass with recommendation for MRI to further correlate.   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   She was admitted for GI evaluation and further work-up regarding underlying lesions.   PMT was consulted for ongoing [place] discussions and pain control.  Subjective:   Subjective: Chart Reviewed. Updates received. Patient Assessed. Created space and opportunity for patient  and family to explore thoughts and feelings regarding current medical situation.  Today's Discussion: Today I met with the patient at the bedside.  She states that a foley catheter has been placed, she complains of pain being somewhat uncontrolled, she is trying to use her PCA boluses as well, she requests to continue her PCA.    I provided emotional general support and therapeutic listening, empathy and other techniques.  Answered all questions and addressed all concerns  to the best of my ability.   No need for further adjustment at this time.  Review of Systems  Gastrointestinal:  Positive for abdominal pain ("tolerable"), nausea and vomiting (last night).   Objective:   Vital Signs:  BP (!) 148/63 (BP Location: Right Leg)    Pulse 95    Temp 97.7 F (36.5 C) (Oral)    Resp 14    Ht 5' 2.5" (1.588 m)    Wt 75.7 kg    LMP 06/30/2011    SpO2 98%    BMI 30.04 kg/m   Physical Exam: Physical Exam Vitals and nursing note reviewed.  Constitutional:      General: She is not in acute distress.    Appearance: Normal appearance.  HENT:     Head: Normocephalic and atraumatic.  Cardiovascular:     Rate and Rhythm: Normal rate.  Pulmonary:     Effort: Pulmonary effort is normal. No respiratory distress.     Breath sounds: No wheezing or rhonchi.  Abdominal:     General: Abdomen is flat.     Palpations: Abdomen is soft.  Skin:    General: Skin is warm and dry.  Neurological:     General: No focal deficit present.     Mental Status: She is alert.  Psychiatric:        Mood and Affect: Mood normal.        Behavior: Behavior normal.    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  She is awaiting results of her  biopsy.  At times, she is still having significant pain primarily in the abdomen and buttock/sciatic area (possible nerve root compression from thoracic region?).  He is working pain management and PM&R with goals of care discussions which can be further s/p results.  SUMMARY OF RECOMMENDATIONS   Pain management via PCA as per below - continue same dosing Await further work-up Plan for ongoing goals of care discussion as biopsy results return Plan follow-up with Palliative/[facility] symptom management clinic PMT will continue  to follow  Symptom Management:  Continue Dilaudid PCA: Basal rate 0.5 mg/hr; demand dose 0.3 mg q 15 min prn Change Zofran to q 4 hours prn  Code Status: Full code  Prognosis: Unable to determine  Discharge Planning: To Be Determined  Discussed with: Medical team, nursing team, patient  Thank you for allowing [name] to participate in the care of LAINEY NELSON PMT will continue to support holistically.  MDM high: Acute or chronic illness posing threat to life or limb, discussion of management plan with other physicians/healthcare personnel (medical team, nursing team), ongoing management of parenteral opioid medications.  Loistine Chance MD.  Palliative Medicine Team  Team Phone # [phone]

## 2021-02-01 NOTE — Progress Notes (Signed)
[facility] Gastroenterology Progress Note  Gloria Ward 71 y.o. 05-11-1950  CC: Metastatic pancreatic cancer   Subjective: Patient seen and examined at bedside.  Pain is well controlled with PCA pump.  Denies any constipation.  No further vomiting.  Occasional nausea.  ROS : Afebrile.  Negative for chest pain.  Negative for melena   Objective: Vital signs in last 24 hours: Vitals:   02/01/21 0820 02/01/21 1118  BP:    Pulse:    Resp: 14 14  Temp:    SpO2: 94% 98%    Physical Exam:  General.  Resting comfortably, not in acute distress Abdomen : Minimally distended, nontender, bowel sounds present, no peritoneal signs. Neuro : Alert and oriented x3 Psych.  Mood and affect normal Lab Results: Recent Labs    01/31/21 0459 02/01/21 0539  NA 132* 133*  K 4.2 3.9  CL 98 99  CO2 26 28  GLUCOSE 143* 109*  BUN 13 11  CREATININE 0.77 0.62  CALCIUM 8.8* 8.7*  MG 2.1 2.1   No results for input(s): AST, ALT, ALKPHOS, BILITOT, PROT, ALBUMIN in the last 72 hours. Recent Labs    01/31/21 0459 02/01/21 0539  WBC 9.8 9.1  HGB 10.7* 9.3*  HCT 33.4* 29.2*  MCV 83.7 84.4  PLT 154 137*   No results for input(s): LABPROT, INR in the last 72 hours.    Assessment/Plan: Assessment ---------------- -Pancreatic tail mass along with liver lesion as well as suspicious lesion at T10 level.  Patient with significantly elevated CA 19-9.  Finding concerning for metastatic pancreatic cancer. -Splenic vein thrombosis -started on anticoagulation. -Melena.  Resolved now -Abdominal  pain.    Recommendations ------------------------- -Follow liver biopsy pathology report -Advance diet to regular -EUS only if liver biopsy does not confirm diagnosis -IV Protonix 40 mg twice a day -GI will follow   Otis Brace MD, [place] 02/01/2021, 2:09 PM  Contact #  [phone]

## 2021-02-01 NOTE — Progress Notes (Signed)
Progress Note    Gloria Ward   [id number]:[id number]  DOB: 21-Mar-1950  DOA: 01/27/2021     4 PCP: Lawerance Cruel, MD  Initial CC: abdominal and back pain, dark stools  Hospital Course: Ms. Berens is a 71 yo female with PMH anxiety, HTN, Hashimoto's thyroiditis who presented to the hospital with approximately 2-week complaint of intermittent abdominal pain also radiating to her back.  She was also having black tarry stools at home.  She endorsed taking Aleve at essentially max dose for approximately 4 weeks prior to admission and was also using Norco for pain control. CT abdomen/pelvis on admission showed a splenic vein thrombus and pancreatic mass with recommendation for MRI to further correlate. 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 She was admitted for GI evaluation and further work-up regarding underlying lesions.  Interval History:  Some worsening abdominal distension yesterday but abd xray reassuring and she states she's been having bowel movements. We will continue to monitor this however going forward, especially given known abdominal findings.  She had no other concerns this am.   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  Multiple possible  etiologies including underlying lesion involving the tail of pancreas; patient also has been on daily use of NSAIDs with melanotic stools with risk for ulceration/GIB -Palliative care also consulted on admission for assistance with pain control and possible further GOC discussions pending underlying malignancy work-up -Some worsening abdominal distention and pain on 01/31/2021.  She is still having bowel movements.  X-ray obtained and did not show any acute abnormalities.  She does have potential for progression of underlying disease process  GIB (gastrointestinal bleeding) - risk for NSAID induced erosions/ulcerations; patient endorses using Aleve at home for approximately 4 weeks; melanotic stools reported on admission  -GI consulted on admission; currently monitoring Hgb -Continue trending H/H and will transfuse if further drops but so far remaining stable - continue PPI for now   Hypothyroidism - Patient endorses history of Hashimoto's thyroiditis, managed by her primary care outpatient.  Unable to review any labs - Patient reports self adjustment of Cytomel at home.  States that she has not tolerated T4 replacement in the past -Discussed further with patient today.  Thyroid studies have returned and I reviewed them with her as well as discussed with endocrinology -At this time we will start low-dose levothyroxine and continue Cytomel at lower doses.  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  Likely related to underlying malignancy work-up - Continue heparin drip and will need to be transitioned to oral anticoagulation prior to discharge; TOC consult to see if Eliquis vs Xarelto cheaper for patient  -If bleeding or hemoglobin drop, will need to discontinue heparin drip  Essential hypertension - BP more controlled  - Use labetalol or hydralazine as needed    Old records reviewed  in assessment of this patient  Antimicrobials:   DVT prophylaxis: Heparin drip  Code Status:   Code Status: Full Code  Disposition Plan: Home Status is: Inpatient  Objective: Blood pressure (!) 148/63, pulse 95, temperature 97.7 F (36.5 C), temperature source Oral, resp. rate 14, height 5' 2.5" (1.588 m), weight 75.7 kg, last menstrual period 06/30/2011, SpO2 98 %.  Examination:  Physical Exam Constitutional:      Appearance: Normal appearance.  HENT:     Head: Normocephalic and atraumatic.     Mouth/Throat:     Mouth: Mucous membranes are moist.  Eyes:     Extraocular Movements: Extraocular movements intact.  Cardiovascular:     Rate and Rhythm: Normal rate and regular rhythm.  Pulmonary:     Effort: Pulmonary effort is normal.     Breath sounds: Normal breath sounds.  Abdominal:     General: Bowel sounds are normal. There is no distension.     Palpations: Abdomen is soft.     Tenderness: There is abdominal tenderness.  Musculoskeletal:        General: Normal range of motion.     Cervical back: Normal range of motion and neck supple.  Skin:    General: Skin is warm and dry.  Neurological:     General: No focal deficit present.     Mental Status: She is alert.  Psychiatric:        Mood and Affect: Mood normal.        Behavior: Behavior normal.     Consultants:  GI Palliative care Oncology IR  Procedures:  Liver biopsy, 01/30/2021  Data Reviewed: I have personally reviewed labs and imaging studies    LOS: 4 days  Time spent: Greater than 50% of the 35 minute visit was spent in counseling/coordination of care for the patient as laid out in the A&P.   Dwyane Dee, MD [facility] 02/01/2021, 1:22 PM

## 2021-02-02 ENCOUNTER — Inpatient Hospital Stay (HOSPITAL_COMMUNITY): Payer: 59

## 2021-02-02 ENCOUNTER — Encounter (HOSPITAL_COMMUNITY): Payer: Self-pay | Admitting: Family Medicine

## 2021-02-02 DIAGNOSIS — K922 Gastrointestinal hemorrhage, unspecified: Secondary | ICD-10-CM | POA: Diagnosis not present

## 2021-02-02 DIAGNOSIS — R1084 Generalized abdominal pain: Secondary | ICD-10-CM | POA: Diagnosis not present

## 2021-02-02 DIAGNOSIS — I81 Portal vein thrombosis: Secondary | ICD-10-CM | POA: Diagnosis not present

## 2021-02-02 DIAGNOSIS — D378 Neoplasm of uncertain behavior of other specified digestive organs: Secondary | ICD-10-CM | POA: Diagnosis not present

## 2021-02-02 LAB — BASIC METABOLIC PANEL
Anion gap: 7 (ref 5–15)
BUN: 17 mg/dL (ref 8–23)
CO2: 29 mmol/L (ref 22–32)
Calcium: 8.5 mg/dL — ABNORMAL LOW (ref 8.9–10.3)
Chloride: 96 mmol/L — ABNORMAL LOW (ref 98–111)
Creatinine, Ser: 0.71 mg/dL (ref 0.44–1.00)
GFR, Estimated: >60 mL/min (ref >60)
Glucose, Bld: 134 mg/dL — ABNORMAL HIGH (ref 70–99)
Potassium: 4 mmol/L (ref 3.5–5.1)
Sodium: 132 mmol/L — ABNORMAL LOW (ref 135–145)

## 2021-02-02 LAB — CBC
HCT: 27.5 % — ABNORMAL LOW (ref 36.0–46.0)
Hemoglobin: 8.7 g/dL — ABNORMAL LOW (ref 12.0–15.0)
MCH: 26.6 pg (ref 26.0–34.0)
MCHC: 31.6 g/dL (ref 30.0–36.0)
MCV: 84.1 fL (ref 80.0–100.0)
Platelets: 148 10*3/uL — ABNORMAL LOW (ref 150–400)
RBC: 3.27 MIL/uL — ABNORMAL LOW (ref 3.87–5.11)
RDW: 13.6 % (ref 11.5–15.5)
WBC: 9.3 10*3/uL (ref 4.0–10.5)
nRBC: 0 % (ref 0.0–0.2)

## 2021-02-02 LAB — MAGNESIUM: Magnesium: 2.2 mg/dL (ref 1.7–2.4)

## 2021-02-02 LAB — HEPARIN LEVEL (UNFRACTIONATED): Heparin Unfractionated: 0.58 IU/mL (ref 0.30–0.70)

## 2021-02-02 LAB — HEMOGLOBIN AND HEMATOCRIT, BLOOD
HCT: 23.9 % — ABNORMAL LOW (ref 36.0–46.0)
Hemoglobin: 7.6 g/dL — ABNORMAL LOW (ref 12.0–15.0)

## 2021-02-02 LAB — PREPARE RBC (CROSSMATCH)

## 2021-02-02 MED ORDER — IOHEXOL 300 MG/ML  SOLN
100.0000 mL | Freq: Once | INTRAMUSCULAR | Status: AC | PRN
  Administered 2021-02-02: 100 mL via INTRAVENOUS

## 2021-02-02 MED ORDER — IOHEXOL 9 MG/ML PO SOLN: 500.0000 mL | ORAL | Status: AC

## 2021-02-02 MED ORDER — HYDROMORPHONE 1 MG/ML IV SOLN
INTRAVENOUS | Status: DC
  Administered 2021-02-02: 2.77 mg via INTRAVENOUS

## 2021-02-02 MED ORDER — CIPROFLOXACIN IN D5W 400 MG/200ML IV SOLN
400.0000 mg | Freq: Two times a day (BID) | INTRAVENOUS | Status: DC
  Administered 2021-02-02 – 2021-02-05 (×6): 400 mg via INTRAVENOUS
  Filled 2021-02-02 (×6): qty 200

## 2021-02-02 MED ORDER — SODIUM CHLORIDE (PF) 0.9 % IJ SOLN
INTRAMUSCULAR | Status: AC
  Filled 2021-02-02: qty 50

## 2021-02-02 MED ORDER — METRONIDAZOLE 500 MG/100ML IV SOLN
500.0000 mg | Freq: Two times a day (BID) | INTRAVENOUS | Status: DC
  Administered 2021-02-02 – 2021-02-05 (×6): 500 mg via INTRAVENOUS
  Filled 2021-02-02 (×6): qty 100

## 2021-02-02 MED ORDER — SODIUM CHLORIDE 0.9% IV SOLUTION: Freq: Once | INTRAVENOUS | Status: DC

## 2021-02-02 MED ORDER — IOHEXOL 9 MG/ML PO SOLN
ORAL | Status: AC
  Filled 2021-02-02: qty 1000

## 2021-02-02 MED ORDER — HYDROMORPHONE 1 MG/ML IV SOLN
INTRAVENOUS | Status: DC
  Administered 2021-02-02: 0.298 mg via INTRAVENOUS
  Administered 2021-02-02: 1.64 mg via INTRAVENOUS
  Administered 2021-02-03: 0.5 mg via INTRAVENOUS
  Administered 2021-02-03: 2.5 mg via INTRAVENOUS
  Administered 2021-02-03: 2.29 mg via INTRAVENOUS
  Administered 2021-02-03: 1 mg via INTRAVENOUS

## 2021-02-02 MED ORDER — ONDANSETRON HCL 4 MG/2ML IJ SOLN
4.0000 mg | Freq: Four times a day (QID) | INTRAMUSCULAR | Status: AC
Start: 2021-02-02 — End: 2021-02-03
  Administered 2021-02-02 – 2021-02-03 (×4): 4 mg via INTRAVENOUS
  Filled 2021-02-02 (×3): qty 2

## 2021-02-02 NOTE — Progress Notes (Signed)
Daily Progress Note   Patient Name: Gloria Ward       Date: 02/02/2021 DOB: [birth year]  Age: 71 y.o. MRN#: [id number] Attending Physician: Dwyane Dee, MD Primary Care Physician: Lawerance Cruel, MD Admit Date: 01/27/2021 Length of Stay: 5 days  Reason for Consultation/Follow-up: Establishing goals of care and Pain control  HPI/Patient Profile:  71 y.o. female  with past medical history of anxiety, HTN, Hashimoto's thyroiditis who presented to the hospital with approximately 2-week complaint of intermittent abdominal pain also radiating to her back.  She was also having black tarry stools at home.  She endorsed taking Aleve at essentially max dose for approximately 4 weeks prior to admission and was also using Norco for pain control.   CT abdomen/pelvis on admission showed a splenic vein thrombus and pancreatic mass with recommendation for MRI to further correlate.   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   She was admitted for GI evaluation and further work-up regarding underlying lesions.   PMT was consulted for ongoing [place] discussions and pain control.  Subjective:   Subjective: Chart Reviewed. Updates received. Patient Assessed. Created space and opportunity for patient  and family to explore thoughts and feelings regarding current medical situation.  Today's Discussion: Today I met with the patient along with her bedside RN colleague Jenny Reichmann at the bedside.  She has nausea and uncontrolled pain.     I provided emotional general support and therapeutic listening, empathy and other techniques.  Answered all questions and addressed all concerns to the best of my ability. PCA pump interrogated: 14 mg in the past 24 hours, will increase bolus  from 0.3 to 0.5 mg to her PCA settings, discussed with bedside RN.     Objective:   Vital Signs:  BP (!) 142/86 (BP Location: Left Arm)    Pulse 96    Temp 97.8 F (36.6 C) (Oral)    Resp 14    Ht 5' 2.5" (1.588 m)    Wt 75.7 kg    LMP 06/30/2011    SpO2 97%    BMI 30.04 kg/m   Physical Exam: Physical Exam Vitals and nursing note reviewed.  Constitutional:      General: She is not in acute distress.    Appearance: Normal appearance.  HENT:     Head: Normocephalic and atraumatic.  Cardiovascular:     Rate and Rhythm: Normal rate.  Pulmonary:     Effort: Pulmonary effort is normal. No respiratory distress.     Breath sounds: No wheezing or rhonchi.  Abdominal:     General: Abdomen is flat.     Palpations: Abdomen is soft.  Skin:    General: Skin is warm and dry.  Neurological:     General: No focal deficit present.     Mental Status: She is alert.  Psychiatric:        Mood and Affect: Mood normal.        Behavior: Behavior normal.    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  She is awaiting results of her  biopsy.  At times, she  is still having significant pain primarily in the abdomen and buttock/sciatic area (possible nerve root compression from thoracic region?).  He is working pain management and PM&R with goals of care discussions which can be further s/p results.  SUMMARY OF RECOMMENDATIONS   Pain management via PCA adjust bolus dose and monitor Await further work-up Plan for ongoing goals of care discussion as biopsy results return Plan follow-up with Palliative/[facility] symptom management clinic PMT will continue to follow  Symptom Management:  Continue Dilaudid PCA: Basal rate 0.5 mg/hr; demand dose 0.5 mg q 15 min prn Change Zofran to  q 4 hours prn, have scheduled IV Zofran for 24 hours and monitor.   Code Status: Full code  Prognosis: Unable to determine  Discharge Planning: To Be Determined  Discussed with: Medical team, nursing team, patient  Thank you for allowing [name] to participate in the care of Gloria Ward PMT will continue to support holistically.  MDM high: Acute or chronic illness posing threat to life or limb, discussion of management plan with other physicians/healthcare personnel (medical team, nursing team), ongoing management of parenteral opioid medications.  Loistine Chance MD.  Palliative Medicine Team  Team Phone # [phone]

## 2021-02-02 NOTE — Progress Notes (Signed)
[facility] Progress Note  Gloria Ward 71 y.o. 13-May-1950  CC: Metastatic pancreatic cancer   Subjective: Patient seen and examined at bedside.  Complaining of more abdominal pain and distention today.  Having daily bowel movements.  Passing gas.  Denies any vomiting.  ROS : Afebrile.  Negative for chest pain.  Positive for nausea.   Objective: Vital signs in last 24 hours: Vitals:   02/02/21 0431 02/02/21 0808  BP: (!) 142/86   Pulse: 96   Resp: 18 14  Temp: 97.8 F (36.6 C)   SpO2: 100% 97%    Physical Exam:  General.  Resting comfortably, not in acute distress Abdomen : Abdomen is more distended compared to yesterday, nontender, bowel sounds present, no peritoneal signs. Neuro : Alert and oriented x3 Psych.  Mood and affect normal Lab Results: Recent Labs    02/01/21 0539 02/02/21 0532  NA 133* 132*  K 3.9 4.0  CL 99 96*  CO2 28 29  GLUCOSE 109* 134*  BUN 11 17  CREATININE 0.62 0.71  CALCIUM 8.7* 8.5*  MG 2.1 2.2   No results for input(s): AST, ALT, ALKPHOS, BILITOT, PROT, ALBUMIN in the last 72 hours. Recent Labs    02/01/21 0539 02/02/21 0532  WBC 9.1 9.3  HGB 9.3* 8.7*  HCT 29.2* 27.5*  MCV 84.4 84.1  PLT 137* 148*   No results for input(s): LABPROT, INR in the last 72 hours.    Assessment/Plan: Assessment ---------------- -Pancreatic tail mass along with liver lesion as well as suspicious lesion at T10 level.  Patient with significantly elevated CA 19-9.  Finding concerning for metastatic pancreatic cancer. -Splenic vein thrombosis -started on anticoagulation. -Melena.  Resolved now -Abdominal  pain.    Recommendations ------------------------- -Patient with worsening abdominal distention.  Having daily bowel movements.  No vomiting.  Complaining of nausea. -Repeat abdominal x-ray. -Follow liver biopsy -EUS only if liver biopsy does not confirm diagnosis -GI will follow   Otis Brace MD, FACP 02/02/2021, 11:06  AM  Contact #  [phone]

## 2021-02-02 NOTE — Progress Notes (Signed)
Progress Note    Gloria Ward   AQT:[id number]  DOB: [birth year]  DOA: 01/27/2021     5 PCP: Lawerance Cruel, MD  Initial CC: abdominal and back pain, dark stools  Hospital Course: Gloria Ward is a 71 yo female with PMH anxiety, HTN, Hashimoto's thyroiditis who presented to the hospital with approximately 2-week complaint of intermittent abdominal pain also radiating to her back.  She was also having black tarry stools at home.  She endorsed taking Aleve at essentially max dose for approximately 4 weeks prior to admission and was also using Norco for pain control. CT abdomen/pelvis on admission showed a splenic vein thrombus and pancreatic mass with recommendation for MRI to further correlate. 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 She was admitted for GI evaluation and further work-up regarding underlying lesions.  Interval History:  Late yesterday she began developing increased abd distension. Xray showed stool burden otherwise okay. She's been having bowel movements that are melanotic. Late this morning she vomited blood (seen personally in basin) and had a melanotic stool in the bathroom. Her son was present bedside and I also updated him at length too.  I encouraged patient to continue family discussions regarding her wishes just in case things do start to worsen rapidly also.   We are getting H/H check, giving 1 unit blood, and getting further abdominal imaging.   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  Multiple possible etiologies including underlying lesion involving the tail of pancreas; patient also has been on daily use of NSAIDs with melanotic stools with risk for ulceration/GIB -Palliative care also consulted on admission for assistance with pain control and possible further GOC discussions pending underlying malignancy work-up -Some worsening abdominal distention and pain on 01/31/2021.  She is still having bowel movements.  X-ray obtained and did not show any acute abnormalities (stool burden).  She does have potential for progression of underlying disease process - worsening pain/distension with episode of hematemesis on 1/22; abd xray repeated with dilated small bowel concerning for developing ileus; CT A/P ordered to better evaluate  - make NPO for now; if vomits again, may need NJT; discussed with GI as well  - d/c basal rate on PCA and continue bolus only for now  GIB (gastrointestinal bleeding) - risk for NSAID induced erosions/ulcerations; patient endorses using Aleve at home for approximately 4 weeks; melanotic stools reported on admission  -GI consulted on admission; currently monitoring  - continue PPI for now  - episode of hematemesis on 1/22. Discontinue heparin drip due to this; still melanotic stools; STAT H/H shows Hgb down to 7.6 g/dL post hematemesis so will give 1 unit PRBC and trend H/H  Hypothyroidism - Patient endorses history of Hashimoto's thyroiditis, managed by her primary care outpatient.  Unable to review any labs - Patient reports self adjustment of Cytomel at home.  States that she has not tolerated T4 replacement in the past -Discussed further with patient  today.  Thyroid studies have returned and I reviewed them with her as well as discussed with endocrinology -At this time we will start low-dose levothyroxine and continue Cytomel at lower doses.  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  Likely related to underlying malignancy work-up - s/p hematemesis on 1/22, ongoing melena and now further drop in Hgb - discontinue heparin drip at this time - trend H/H as needed  Essential hypertension - BP more controlled  - Use labetalol or hydralazine as needed    Old records reviewed in assessment of this patient  Antimicrobials:   DVT prophylaxis: Heparin drip stopped on 1/22 due to hematemesis   Code Status:   Code Status: Full Code  Disposition Plan: Home Status is: Inpatient  Objective: Blood pressure 113/68, pulse (!) 101, temperature 97.8 F (36.6 C), temperature source Oral, resp. rate 16, height 5' 2.5" (1.588 m), weight 75.7 kg, last menstrual period 06/30/2011, SpO2 98 %.  Examination:  Physical Exam Constitutional:      Appearance: Normal appearance.  HENT:     Head: Normocephalic and atraumatic.     Mouth/Throat:     Mouth: Mucous membranes are moist.  Eyes:     Extraocular Movements: Extraocular movements intact.  Cardiovascular:     Rate and Rhythm: Normal rate and regular rhythm.  Pulmonary:     Effort: Pulmonary effort is normal.     Breath sounds: Normal breath sounds.  Abdominal:     General: Bowel sounds are decreased. There is distension.     Palpations: Abdomen is soft.     Tenderness: There is generalized abdominal tenderness.     Comments: Bowel sounds starting to become tinkling   Musculoskeletal:        General: Normal range of motion.     Cervical back: Normal range of motion and neck supple.  Skin:    General: Skin is warm and dry.  Neurological:     General: No focal deficit present.      Mental Status: She is alert.  Psychiatric:        Mood and Affect: Mood normal.        Behavior: Behavior normal.     Consultants:  GI Palliative care Oncology IR  Procedures:  Liver biopsy, 01/30/2021  Data Reviewed: I have personally reviewed labs and imaging studies    LOS: 5 days  Time spent: Greater than 50% of the 35 minute visit was spent in counseling/coordination of care for the patient as laid out in the A&P.   Dwyane Dee, MD [facility] 02/02/2021, 2:34 PM

## 2021-02-02 NOTE — Progress Notes (Signed)
[place] for Heparin Indication: Splenic Vein Thrombus  Allergies  Allergen Reactions   Amlodipine Swelling   Tramadol Hcl Other (See Comments)   Trazodone Hcl Other (See Comments)   Penicillins Rash and Other (See Comments)    Rash and hot all over. Has tolerated Keflex    Patient Measurements: Height: 5' 2.5" (158.8 cm) Weight: 75.7 kg (166 lb 14.4 oz) IBW/kg (Calculated) : 51.25 Heparin Dosing Weight: 68 kg  Vital Signs: Temp: 97.8 F (36.6 C) (01/22 0431) Temp Source: Oral (01/22 0431) BP: 142/86 (01/22 0431) Pulse Rate: 96 (01/22 0431)  Labs: Recent Labs    01/31/21 0459 01/31/21 1348 01/31/21 2000 02/01/21 0539 02/02/21 0532  HGB 10.7*  --   --  9.3* 8.7*  HCT 33.4*  --   --  29.2* 27.5*  PLT 154  --   --  137* 148*  HEPARINUNFRC 0.20*   < > 0.67 0.63 0.58  CREATININE 0.77  --   --  0.62 0.71   < > = values in this interval not displayed.     Estimated Creatinine Clearance: 64.9 mL/min (by C-G formula based on SCr of 0.71 mg/dL).   Medical History: Past Medical History:  Diagnosis Date   Anxiety    Heart murmur    no prob - no med   Hypertension    Hypothyroidism    Insomnia     Medications:  No oral anticoagulation PTA  Assessment: 71 yr female s/p abdominal surgery October 2022 presented with abdominal pain.  CT of abdomen/pelvis = + splenic vein thrombus as well as pancreatic masses.  Noted patient had reported black stools previously during admit.  Pharmacy was consulted to dose heparin drip without a bolus.    - 1/17: Hemoccult positive, GI was consulted for GIB work-up, and heparin drip held.  However, since hemoglobin is stable and concern for metastases, GI will not be pursuing further work-up at this time.  Discussed with IR - liver biopsy planned for 1/19 and okay to resume heparin drip in the interim.    - 1/19: Heparin held for IR- liver biopsy and resumed 6h post- procedure  Today,  02/02/2021: - Heparin level remains therapeutic at 0.58 - Hgb down 8.7, platelets up 148K - No bleeding documented  Goal of Therapy:  Heparin level 0.3-0.7 units/ml Monitor platelets by anticoagulation protocol: Yes   Plan:  - continue heparin drip at 1350 units/hr - Heparin level, CBC daily - Monitor for further blood in stools - F/u ability to transition to an oral anticoagulant  Dia Sitter, PharmD, BCPS 02/02/2021 7:48 AM

## 2021-02-03 ENCOUNTER — Other Ambulatory Visit (HOSPITAL_COMMUNITY): Payer: Self-pay

## 2021-02-03 ENCOUNTER — Inpatient Hospital Stay (HOSPITAL_COMMUNITY): Payer: 59

## 2021-02-03 DIAGNOSIS — G9341 Metabolic encephalopathy: Secondary | ICD-10-CM

## 2021-02-03 LAB — CBC WITH DIFFERENTIAL/PLATELET
Abs Immature Granulocytes: 0.14 10*3/uL — ABNORMAL HIGH (ref 0.00–0.07)
Basophils Absolute: 0 10*3/uL (ref 0.0–0.1)
Basophils Relative: 0 %
Eosinophils Absolute: 0 10*3/uL (ref 0.0–0.5)
Eosinophils Relative: 0 %
HCT: 26.1 % — ABNORMAL LOW (ref 36.0–46.0)
Hemoglobin: 8.4 g/dL — ABNORMAL LOW (ref 12.0–15.0)
Immature Granulocytes: 1 %
Lymphocytes Relative: 15 %
Lymphs Abs: 1.6 10*3/uL (ref 0.7–4.0)
MCH: 26.7 pg (ref 26.0–34.0)
MCHC: 32.2 g/dL (ref 30.0–36.0)
MCV: 82.9 fL (ref 80.0–100.0)
Monocytes Absolute: 0.6 10*3/uL (ref 0.1–1.0)
Monocytes Relative: 6 %
Neutro Abs: 8.2 10*3/uL — ABNORMAL HIGH (ref 1.7–7.7)
Neutrophils Relative %: 78 %
Platelets: 191 10*3/uL (ref 150–400)
RBC: 3.15 MIL/uL — ABNORMAL LOW (ref 3.87–5.11)
RDW: 14.9 % (ref 11.5–15.5)
WBC: 10.6 10*3/uL — ABNORMAL HIGH (ref 4.0–10.5)
nRBC: 0 % (ref 0.0–0.2)

## 2021-02-03 LAB — MAGNESIUM: Magnesium: 2.4 mg/dL (ref 1.7–2.4)

## 2021-02-03 LAB — BASIC METABOLIC PANEL
Anion gap: 5 (ref 5–15)
BUN: 25 mg/dL — ABNORMAL HIGH (ref 8–23)
CO2: 30 mmol/L (ref 22–32)
Calcium: 8.2 mg/dL — ABNORMAL LOW (ref 8.9–10.3)
Chloride: 97 mmol/L — ABNORMAL LOW (ref 98–111)
Creatinine, Ser: 0.7 mg/dL (ref 0.44–1.00)
GFR, Estimated: >60 mL/min (ref >60)
Glucose, Bld: 164 mg/dL — ABNORMAL HIGH (ref 70–99)
Potassium: 3.9 mmol/L (ref 3.5–5.1)
Sodium: 132 mmol/L — ABNORMAL LOW (ref 135–145)

## 2021-02-03 LAB — TYPE AND SCREEN
ABO/RH(D): O POS
Antibody Screen: NEGATIVE
Unit division: 0

## 2021-02-03 LAB — BPAM RBC
Blood Product Expiration Date: 202302212359
ISSUE DATE / TIME: 202301221814
Unit Type and Rh: 5100

## 2021-02-03 LAB — CBC
HCT: 23.9 % — ABNORMAL LOW (ref 36.0–46.0)
Hemoglobin: 7.8 g/dL — ABNORMAL LOW (ref 12.0–15.0)
MCH: 27 pg (ref 26.0–34.0)
MCHC: 32.6 g/dL (ref 30.0–36.0)
MCV: 82.7 fL (ref 80.0–100.0)
Platelets: 162 10*3/uL (ref 150–400)
RBC: 2.89 MIL/uL — ABNORMAL LOW (ref 3.87–5.11)
RDW: 14.6 % (ref 11.5–15.5)
WBC: 8.8 10*3/uL (ref 4.0–10.5)
nRBC: 0 % (ref 0.0–0.2)

## 2021-02-03 LAB — BLOOD GAS, ARTERIAL
Acid-Base Excess: 5.4 mmol/L — ABNORMAL HIGH (ref 0.0–2.0)
Bicarbonate: 28.2 mmol/L — ABNORMAL HIGH (ref 20.0–28.0)
O2 Saturation: 95.3 %
Patient temperature: 98.6
pCO2 arterial: 34.8 mmHg (ref 32.0–48.0)
pH, Arterial: 7.52 — ABNORMAL HIGH (ref 7.350–7.450)
pO2, Arterial: 67.9 mmHg — ABNORMAL LOW (ref 83.0–108.0)

## 2021-02-03 LAB — SURGICAL PATHOLOGY

## 2021-02-03 LAB — MRSA NEXT GEN BY PCR, NASAL: MRSA by PCR Next Gen: NOT DETECTED

## 2021-02-03 MED ORDER — CHLORHEXIDINE GLUCONATE 0.12 % MT SOLN
15.0000 mL | Freq: Two times a day (BID) | OROMUCOSAL | Status: DC
  Administered 2021-02-03 – 2021-02-10 (×10): 15 mL via OROMUCOSAL
  Filled 2021-02-03 (×12): qty 15

## 2021-02-03 MED ORDER — NALOXONE HCL 0.4 MG/ML IJ SOLN
INTRAMUSCULAR | Status: AC
  Administered 2021-02-03: 0.4 mg via INTRAVENOUS
  Filled 2021-02-03: qty 1

## 2021-02-03 MED ORDER — NALOXONE HCL 0.4 MG/ML IJ SOLN
INTRAMUSCULAR | Status: AC
  Filled 2021-02-03: qty 1

## 2021-02-03 MED ORDER — NALOXONE HCL 0.4 MG/ML IJ SOLN
0.4000 mg | Freq: Once | INTRAMUSCULAR | Status: AC
  Administered 2021-02-03: 0.4 mg via INTRAVENOUS

## 2021-02-03 MED ORDER — GADOBUTROL 1 MMOL/ML IV SOLN
9.0000 mL | Freq: Once | INTRAVENOUS | Status: AC | PRN
  Administered 2021-02-03: 9 mL via INTRAVENOUS

## 2021-02-03 MED ORDER — ORAL CARE MOUTH RINSE
15.0000 mL | Freq: Two times a day (BID) | OROMUCOSAL | Status: DC
  Administered 2021-02-03 – 2021-02-06 (×8): 15 mL via OROMUCOSAL

## 2021-02-03 MED ORDER — NALOXONE HCL 0.4 MG/ML IJ SOLN: 0.4000 mg | Freq: Once | INTRAMUSCULAR | Status: AC

## 2021-02-03 NOTE — TOC Benefit Eligibility Note (Signed)
Patient Teacher, English as a foreign language completed.    The patient is currently admitted and upon discharge could be taking Eliquis 5 mg.  The current 30 day co-pay is, $35.00.   The patient is currently admitted and upon discharge could be taking Xarelto 20 mg.  The current 30 day co-pay is, $35.00.   The patient is insured through [employer], Lily Patient [place] Patient Advocate Team Direct Number: [phone]  Fax: [phone]

## 2021-02-03 NOTE — Progress Notes (Signed)
Daily Progress Note   Patient Name: Gloria Ward       Date: 02/03/2021 DOB: 07-18-1950  Age: 71 y.o. MRN#: [id number] Attending Physician: Dwyane Dee, MD Primary Care Physician: Lawerance Cruel, MD Admit Date: 01/27/2021 Length of Stay: 6 days  Reason for Consultation/Follow-up: Establishing goals of care and Pain control  HPI/Patient Profile:  71 y.o. female  with past medical history of anxiety, HTN, Hashimoto's thyroiditis who presented to the hospital with approximately 2-week complaint of intermittent abdominal pain also radiating to her back.  She was also having black tarry stools at home.  She endorsed taking Aleve at essentially max dose for approximately 4 weeks prior to admission and was also using Norco for pain control.   CT abdomen/pelvis on admission showed a splenic vein thrombus and pancreatic mass with recommendation for MRI to further correlate.   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   She was admitted for GI evaluation and further work-up regarding underlying lesions.   PMT was consulted for ongoing [place] discussions and pain control.  Subjective:   Subjective: Chart Reviewed. Updates received, discussed with [facility] MD Dr Sabino Gasser.   Patient was seen today in the context of her undergoing a rapid response, she is to be moved down to the progressive/intensive care unit.  Son and daughter present at the bedside.  Noted to have ongoing GI bleed, increased symptom burden and more altered mental status.  Currently being given Narcan.  Discussed with son and daughter about patient's current condition, broad goals of care discussions and CODE STATUS discussions undertaken, provided general emotional support and therapeutic listening as  patient's son and daughter navigate the patient's acute and ongoing decline. 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  She is awaiting results of her  biopsy.  At times, she is still having significant pain primarily in the abdomen and buttock/sciatic area (possible nerve root compression from thoracic region?).  He is working pain management and PM&R with goals of care discussions which can be further s/p results.  SUMMARY OF RECOMMENDATIONS   Okay to withhold the PCA and monitor Await further work-up, biopsy results, discussed with patient's son and daughter at bedside about patient's ongoing decline, full code for now however they are aware of the serious nature of her condition. Palliative medicine team will continue to follow for further goals of care discussions.    Code Status: Full code  Prognosis: Unable to determine  Discharge Planning: To Be Determined  Discussed with: Medical team, nursing team, patient  Thank you for allowing [name]  to participate in the care of ASHELY GOOSBY PMT will continue to support holistically.  MDM high: Acute or chronic illness posing threat to life or limb, discussion of management plan with other physicians/healthcare personnel (medical team, nursing team), ongoing management of parenteral opioid medications.  Loistine Chance MD.  Palliative Medicine  Team  Team Phone # [phone]

## 2021-02-03 NOTE — Progress Notes (Signed)
Progress Note    Gloria Ward   IHK:[id number]  DOB: Dec 21, 1950  DOA: 01/27/2021     6 PCP: Lawerance Cruel, MD  Initial CC: abdominal and back pain, dark stools  Hospital Course: [name] is a 71 yo female with PMH anxiety, HTN, Hashimoto's thyroiditis who presented to the hospital with approximately 2-week complaint of intermittent abdominal pain also radiating to her back.  She was also having black tarry stools at home.  She endorsed taking Aleve at essentially max dose for approximately 4 weeks prior to admission and was also using Norco for pain control. CT abdomen/pelvis on admission showed a splenic vein thrombus and pancreatic mass with recommendation for MRI to further correlate. 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 She was admitted for GI evaluation and further work-up regarding underlying lesions.  Interval History:  Patient continued to worsen since yesterday.  She had a second worse episode of hematemesis last night around 8:30 PM.  This morning she was noted to be minimally responsive with shallow breathing.  After discussion with her children bedside, her son reported that he had been pushing her PCA button overnight since she was unable to push it.  Rapid response was called this morning and she was transferred to stepdown.  She remains minimally responsive and nonverbal.  Some response to Narcan although still nonverbal. Goals of care again discussed with her children and for now we will continue trying to reverse her encephalopathy if able while awaiting biopsy results and any further plans.  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- (present on admission) - MRI abdomen shows 2.5 x 2.1 cm  hypoenhancing lesion involving tail of pancreas; given the other findings on MRI, concern is for possible primary pancreatic malignancy with metastases - s/p liver biopsy on 01/30/2021; follow-up results while in hospital in case of need for EUS if biopsy results inconclusive -Oncology following as needed; we are awaiting liver biopsy results  - prognosis guarded given evolution of above   Acute metabolic encephalopathy - around 2am on 1/23 patient became less responsive per son; he did endorse to pushing her PCA button due to her appearing in pain but she was unable to push button - rapid response called this am given ongoing decreased mentation and inability to follow commands. Differential includes opioid buildup (s/p 2 doses narcan with RR team) vs stroke (though management would not change) vs severe depression/withdrawn state (though family denies prior severe mood disorders, does have hx anxiety) - d/c dilaudid and sedating agents at this time and allow further wash out - transferred to SDU for closer monitoring; remains FULL code - son and daughter have been updated regarding ongoing guarded prognosis but her overall further clinical decline over the past 24 hours  Abdominal pain - Patient presented with acute onset of abdominal pain.  Multiple possible etiologies including underlying lesion involving the tail of pancreas; patient also has been on daily use of NSAIDs with melanotic stools with risk for ulceration/GIB -Palliative care also consulted on admission for assistance with pain control and possible further GOC discussions pending underlying malignancy work-up -Some worsening abdominal distention and pain on 01/31/2021.  She is still having bowel movements.  X-ray obtained and did not show any acute abnormalities (stool burden).  She does have potential for progression of underlying disease process - worsening pain/distension with episode of hematemesis on 1/22; abd xray repeated  followed by CT A/P (no overt findings of SBO or ileus, but patient still higher risk)  GIB (gastrointestinal bleeding) - risk for NSAID induced erosions/ulcerations; patient endorses using Aleve at home for approximately 4 weeks; melanotic stools reported on admission  -GI consulted on admission; currently monitoring  - continue PPI for now  - multiple episodes of hematemesis on 1/22. Discontinued heparin drip due to this; still melanotic stools; STAT H/H showed Hgb down to 7.6 g/dL post hematemesis - continue trending H/H and will transfuse as needed - transfer to SDU   Hypothyroidism - Patient endorses history of Hashimoto's thyroiditis, managed by her primary care outpatient.  Unable to review any labs - Patient reports self adjustment of Cytomel at home.  States that she has not tolerated T4 replacement in the past -Discussed further with patient today.  Thyroid studies have returned and I reviewed them with her as well as discussed with endocrinology -At this time we will start low-dose levothyroxine and continue Cytomel at lower doses.  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  Likely related to underlying malignancy work-up - s/p hematemesis on 1/22, ongoing melena and now further drop in Hgb - discontinue heparin drip at this time - trend H/H as needed  Essential hypertension - BP more controlled  - Use labetalol or hydralazine as needed    Old records reviewed in assessment of this patient  Antimicrobials:   DVT prophylaxis: Heparin drip stopped on 1/22 due to hematemesis   Code Status:   Code Status: Full Code  Disposition Plan: Home Status is: Inpatient  Objective: Blood pressure 136/67, pulse (!) 119, temperature 97.7 F (36.5 C), temperature source Axillary, resp. rate 14, height 5' 2.5" (1.588 m), weight 75.7 kg, last menstrual period 06/30/2011, SpO2 96  %.  Examination:  Physical Exam Constitutional:      Appearance: Normal appearance.     Comments: Laying in bed minimally responsive and does not respond to voice. Does withdrawal to pain in LE but not upper UE. Opens eyes to voice but will not follow any further commands  HENT:     Head: Normocephalic and atraumatic.     Mouth/Throat:     Mouth: Mucous membranes are moist.  Eyes:     Extraocular Movements: Extraocular movements intact.     Comments: Left eye dropping further than prior  Cardiovascular:     Rate and Rhythm: Normal rate and regular rhythm.  Pulmonary:     Effort: Pulmonary effort is normal.     Breath sounds: Normal breath sounds.     Comments: Decreased respirations Abdominal:     General: Bowel sounds are decreased. There is distension.     Palpations: Abdomen is soft.     Tenderness: There is generalized abdominal tenderness.     Comments: Bowel sounds starting to become tinkling   Musculoskeletal:        General: Normal range of motion.     Cervical back: Normal range of motion and neck supple.  Skin:    General: Skin is warm and dry.  Neurological:     Comments: Moving all 4 extremities spontaneously but purposefully. Withdrawals to pain in LE but not UE     Consultants:  GI Palliative care Oncology IR  Procedures:  Liver biopsy, 01/30/2021  Data Reviewed: I have personally reviewed labs and imaging studies    LOS: 6 days  Time spent: Greater than 50% of the 35 minute visit was spent in counseling/coordination of care for the patient as laid out in the A&P.   Dwyane Dee, MD [facility] 02/03/2021, 1:37 PM

## 2021-02-03 NOTE — Progress Notes (Addendum)
[facility] Gastroenterology Progress Note  Gloria Ward 71 y.o. [birth year]  CC:  Metastatic pancreatic cancer   Subjective: Patient's son states she had a rough day yesterday. Had frequent episodes of hematemesis and melena. Patient is somnolent.  ROS : Review of Systems  Unable to perform ROS: Mental status change     Objective: Vital signs in last 24 hours: Vitals:   02/03/21 0829 02/03/21 1045  BP:    Pulse:    Resp: 14 12  Temp:    SpO2: 96% 96%    Physical Exam:  General:  Somnolent, unresponsive appears stated age  Head:  Normocephalic, without obvious abnormality, atraumatic  Eyes:  Anicteric sclera, EOM's intact  Lungs:   Clear to auscultation bilaterally, respirations unlabored  Heart:  Regular rate and rhythm, S1, S2 normal  Abdomen:   Tense, distended, non-tender. Bowel sounds present.     Lab Results: Recent Labs    02/02/21 0532 02/03/21 0526  NA 132* 132*  K 4.0 3.9  CL 96* 97*  CO2 29 30  GLUCOSE 134* 164*  BUN 17 25*  CREATININE 0.71 0.70  CALCIUM 8.5* 8.2*  MG 2.2 2.4   No results for input(s): AST, ALT, ALKPHOS, BILITOT, PROT, ALBUMIN in the last 72 hours. Recent Labs    02/02/21 0532 02/02/21 1233 02/03/21 0526  WBC 9.3  --  8.8  HGB 8.7* 7.6* 7.8*  HCT 27.5* 23.9* 23.9*  MCV 84.1  --  82.7  PLT 148*  --  162   No results for input(s): LABPROT, INR in the last 72 hours.    Assessment Metastatic pancreatic cancer - abdominal xray suggested dilation of small bowel - CT ab/pelvis with contrast: new small volume ascites, bilateral pleural effusions, and anasarca. No bowel wall thickening or distention. - biopsy results still pending. 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 No need for EUS. Discussed with family option of doing EGD for further evaluation of hematemesis. Son would like to stray from invasive interventions at this time.  If they change their mind, please feel free to call us back. Continue supportive care management [facility] will sign off. Please contact us if we can be of any further assistance during this hospital stay.   Garnette Scheuermann PA-C 02/03/2021, 11:31 AM  Contact #  [phone]

## 2021-02-03 NOTE — Significant Event (Signed)
Rapid Response Event Note   Reason for Call : Altered Mental Status  Notified by [facility] that patient was having a change in mentation. Upon arrival to bedside, patient was on a diluadid gtt but was stopped already. Patient's respirations was 8-9, shallow. 0.4mg  of Narcan given by Norberta Keens. Only 0.1mg  of second dose of narcan given. Patient's respirations increased from 8-18. O2 Sats 95% on 1Liter [place], ETCO2 21.    Initial Focused Assessment:  Neuro: Responds only to pain and sometimes voice, unable to follow commands. After second dose of narcan was being given, patient started to move all extremities but still unable to follow commands. Pupils are dilated, size 4, and reactive to light bilaterally.  Cardiac: HR-ST, BP 136/67 at 1200 Pulmonary: O2 Sats 95%, on RA, RR originally 8-9 but now 16-18 GI: Patient had 2 large maroon stools prior to transfer to SD  Interventions:  Narcan 0.4mg  given x1, 0.1-0.2mg  of narcan given x1 Transfer to SD ABG STAT CBC STAT  Plan of Care:  Transfer to SD   Event Summary:   MD Notified: MD Girguis at bedside, notified by [facility] staff Call Time: [place] Time: 1110 End Time: [place], RN

## 2021-02-03 NOTE — Assessment & Plan Note (Addendum)
-   around 2am on 1/23 patient became less responsive per son; he did endorse to pushing her PCA button due to her appearing in pain but she was unable to push button - rapid response called 1/23 given ongoing decreased mentation and inability to follow commands. Differential includes opioid buildup (s/p 2 doses narcan with RR team) vs stroke (though management would not change) vs severe depression/withdrawn state (though family denies prior severe mood disorders, does have hx anxiety) - she remains awake but non-verbal, not able to interact/follow commands (only looks at you to voice and opens eyes) but not much else purposeful - starting to become restless and develop some signs of pain again this morning so we will have to restart some pain control just in the context of comfort even - check NH3 today, elevated at 68 so will start lactulose enema to see if improves mentation any and continue trending/treating as needed

## 2021-02-04 DIAGNOSIS — C801 Malignant (primary) neoplasm, unspecified: Secondary | ICD-10-CM

## 2021-02-04 LAB — BASIC METABOLIC PANEL
Anion gap: 8 (ref 5–15)
BUN: 20 mg/dL (ref 8–23)
CO2: 24 mmol/L (ref 22–32)
Calcium: 8.4 mg/dL — ABNORMAL LOW (ref 8.9–10.3)
Chloride: 104 mmol/L (ref 98–111)
Creatinine, Ser: 0.62 mg/dL (ref 0.44–1.00)
GFR, Estimated: >60 mL/min (ref >60)
Glucose, Bld: 118 mg/dL — ABNORMAL HIGH (ref 70–99)
Potassium: 3.4 mmol/L — ABNORMAL LOW (ref 3.5–5.1)
Sodium: 136 mmol/L (ref 135–145)

## 2021-02-04 LAB — HEPATIC FUNCTION PANEL
ALT: 24 U/L (ref 0–44)
AST: 29 U/L (ref 15–41)
Albumin: 2.6 g/dL — ABNORMAL LOW (ref 3.5–5.0)
Alkaline Phosphatase: 53 U/L (ref 38–126)
Bilirubin, Direct: 0.1 mg/dL (ref 0.0–0.2)
Indirect Bilirubin: 0.5 mg/dL (ref 0.3–0.9)
Total Bilirubin: 0.6 mg/dL (ref 0.3–1.2)
Total Protein: 5.1 g/dL — ABNORMAL LOW (ref 6.5–8.1)

## 2021-02-04 LAB — CBC
HCT: 24 % — ABNORMAL LOW (ref 36.0–46.0)
Hemoglobin: 7.7 g/dL — ABNORMAL LOW (ref 12.0–15.0)
MCH: 26.7 pg (ref 26.0–34.0)
MCHC: 32.1 g/dL (ref 30.0–36.0)
MCV: 83.3 fL (ref 80.0–100.0)
Platelets: 177 10*3/uL (ref 150–400)
RBC: 2.88 MIL/uL — ABNORMAL LOW (ref 3.87–5.11)
RDW: 14.8 % (ref 11.5–15.5)
WBC: 8.6 10*3/uL (ref 4.0–10.5)
nRBC: 0 % (ref 0.0–0.2)

## 2021-02-04 LAB — AMMONIA: Ammonia: 68 umol/L — ABNORMAL HIGH (ref 9–35)

## 2021-02-04 LAB — HEMOGLOBIN AND HEMATOCRIT, BLOOD
HCT: 24.3 % — ABNORMAL LOW (ref 36.0–46.0)
HCT: 24.3 % — ABNORMAL LOW (ref 36.0–46.0)
Hemoglobin: 8.1 g/dL — ABNORMAL LOW (ref 12.0–15.0)
Hemoglobin: 7.7 g/dL — ABNORMAL LOW (ref 12.0–15.0)

## 2021-02-04 LAB — MAGNESIUM: Magnesium: 2.3 mg/dL (ref 1.7–2.4)

## 2021-02-04 MED ORDER — LORAZEPAM 1 MG PO TABS
1.0000 mg | ORAL_TABLET | Freq: Once | ORAL | Status: AC | PRN
  Administered 2021-02-04: 23:00:00 1 mg via ORAL
  Filled 2021-02-04: qty 1

## 2021-02-04 MED ORDER — LACTULOSE ENEMA
300.0000 mL | Freq: Four times a day (QID) | ORAL | Status: DC
  Filled 2021-02-04: qty 300

## 2021-02-04 MED ORDER — LACTULOSE ENEMA
300.0000 mL | Freq: Once | ORAL | Status: AC
  Administered 2021-02-04: 300 mL via RECTAL
  Filled 2021-02-04: qty 300

## 2021-02-04 MED ORDER — MORPHINE SULFATE (PF) 2 MG/ML IV SOLN
2.0000 mg | INTRAVENOUS | Status: DC | PRN
  Administered 2021-02-04: 12:00:00 2 mg via INTRAVENOUS
  Filled 2021-02-04 (×2): qty 1

## 2021-02-04 MED ORDER — PROCHLORPERAZINE EDISYLATE 10 MG/2ML IJ SOLN
5.0000 mg | Freq: Once | INTRAMUSCULAR | Status: AC
  Administered 2021-02-04: 22:00:00 5 mg via INTRAVENOUS
  Filled 2021-02-04: qty 2

## 2021-02-04 MED ORDER — LEVOTHYROXINE SODIUM 100 MCG/5ML IV SOLN
25.0000 ug | Freq: Every day | INTRAVENOUS | Status: DC
  Administered 2021-02-04 – 2021-02-05 (×2): 25 ug via INTRAVENOUS
  Filled 2021-02-04 (×3): qty 5

## 2021-02-04 MED ORDER — FENTANYL CITRATE (PF) 100 MCG/2ML IJ SOLN
50.0000 ug | INTRAMUSCULAR | Status: DC | PRN
  Administered 2021-02-04 – 2021-02-05 (×13): 50 ug via INTRAVENOUS
  Filled 2021-02-04 (×13): qty 2

## 2021-02-04 MED ORDER — POTASSIUM CHLORIDE 10 MEQ/100ML IV SOLN
10.0000 meq | INTRAVENOUS | Status: AC
  Administered 2021-02-04 (×4): 10 meq via INTRAVENOUS
  Filled 2021-02-04 (×4): qty 100

## 2021-02-04 MED ORDER — SODIUM CHLORIDE 0.9 % IV SOLN: INTRAVENOUS | Status: DC

## 2021-02-04 MED ORDER — LACTULOSE 10 GM/15ML PO SOLN
30.0000 g | Freq: Four times a day (QID) | ORAL | Status: AC
  Administered 2021-02-04 – 2021-02-05 (×3): 30 g via ORAL
  Filled 2021-02-04 (×3): qty 45

## 2021-02-04 MED ORDER — HYDRALAZINE HCL 20 MG/ML IJ SOLN
10.0000 mg | INTRAMUSCULAR | Status: DC | PRN
  Administered 2021-02-04: 11:00:00 10 mg via INTRAVENOUS
  Filled 2021-02-04: qty 1

## 2021-02-04 NOTE — Progress Notes (Addendum)
HEMATOLOGY-ONCOLOGY PROGRESS NOTE  ASSESSMENT AND PLAN: This is a very pleasant 71 year old female patient with chief complaint of abdominal pain found mass in the tail of pancreas, liver lesion and T10 lesion on imaging are concerning for primary pancreatic malignancy admitted for the above.  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  Her CA 19.9 was elevated at 3609.  Given her encephalopathy today, I was unable to communicate these findings to the patient.  Based on the chart, these findings have been discussed with her family already.  Given her encephalopathy and performance status at this time, she is not a candidate for chemotherapy.  We will reevaluate once mentation improves to have further discussions regarding possible treatment options.  Unclear why her mental status has changed.  Initially thought to be due to narcotics but did not improve significantly with with discontinuation of pain medication and Narcan.  MRI of the brain unrevealing.  Liver function normal but ammonia level elevated.  Would consider administration of lactulose but will defer to primary team.  Mikey Bussing, DNP, AGPCNP-BC, AOCNP  SUBJECTIVE: Events over the past 24 hours have been noted.  Now in the unit.  No family at the bedside at the time my visit.  The patient is only minimally awake.  She does respond to my voice.  However, she could not tell me her name or where she was.  Falls asleep very quickly.  REVIEW OF SYSTEMS:   Review of Systems  Reason unable to perform ROS: Confused/somnolent.   I have reviewed the past medical history, past surgical history, social history and family history with the patient and they are unchanged from previous note.   PHYSICAL EXAMINATION: ECOG PERFORMANCE STATUS: 3 - Symptomatic, >50% confined to bed  Vitals:   02/04/21 1219 02/04/21 1300  BP:  (!) 132/42  Pulse: (!)  113 (!) 117  Resp: 15 17  Temp:    SpO2: 98% 98%   Filed Weights   01/27/21 1822 01/28/21 1956 02/03/21 1200  Weight: 77.1 kg 75.7 kg 84.3 kg    Intake/Output from previous day: 01/23 0701 - 01/24 0700 In: 2864.9 [I.V.:2067.8; IV Piggyback:797.1] Out: 2950 [Urine:2950]  Physical Exam Constitutional:      Comments: Minimally alert.  She does respond to my voice but falls asleep very quickly.  HENT:     Head: Normocephalic.  Cardiovascular:     Rate and Rhythm: Tachycardia present.  Pulmonary:     Effort: Pulmonary effort is normal. No respiratory distress.  Abdominal:     Comments: She did not complain of any pain or moaning pain when I palpated her abdomen  Skin:    General: Skin is warm and dry.  Neurological:     Comments: Unable to state her name or where she is Did not follow commands    LABORATORY DATA:  I have reviewed the data as listed CMP Latest Ref Rng & Units 02/04/2021 02/03/2021 02/02/2021  Glucose 70 - 99 mg/dL 118(H) 164(H) 134(H)  BUN 8 - 23 mg/dL 20 25(H) 17  Creatinine 0.44 - 1.00 mg/dL 0.62 0.70 0.71  Sodium 135 - 145 mmol/L 136 132(L) 132(L)  Potassium 3.5 - 5.1 mmol/L 3.4(L) 3.9 4.0  Chloride 98 - 111 mmol/L 104 97(L) 96(L)  CO2 22 - 32 mmol/L 24 30 29   Calcium 8.9 - 10.3 mg/dL 8.4(L) 8.2(L) 8.5(L)  Total Protein 6.5 - 8.1 g/dL 5.1(L) - -  Total Bilirubin 0.3 - 1.2  mg/dL 0.6 - -  Alkaline Phos 38 - 126 U/L 53 - -  AST 15 - 41 U/L 29 - -  ALT 0 - 44 U/L 24 - -    Lab Results  Component Value Date   WBC 8.6 02/04/2021   HGB 8.1 (L) 02/04/2021   HCT 24.3 (L) 02/04/2021   MCV 83.3 02/04/2021   PLT 177 02/04/2021   NEUTROABS 8.2 (H) 02/03/2021    Lab Results  Component Value Date   CAN199 3,609 (H) 01/28/2021    MR BRAIN W WO CONTRAST  Result Date: 02/03/2021 CLINICAL DATA:  Brain metastases suspected EXAM: MRI HEAD WITHOUT AND WITH CONTRAST TECHNIQUE: Multiplanar, multiecho pulse sequences of the brain and surrounding structures were  obtained without and with intravenous contrast. CONTRAST:  68mL GADAVIST GADOBUTROL 1 MMOL/ML IV SOLN COMPARISON:  No prior MRI of the head. FINDINGS: Evaluation is somewhat limited by motion artifact. Brain: No restricted diffusion to suggest acute or subacute infarct. No acute hemorrhage, mass, mass effect, or midline shift. Possible focus of enhancement in the right periventricular white matter (series 16, image 15 and series 17, image 10), which is not definitively seen on the axial postcontrast T1 and may be artifactual. Additional possible punctate lesion is seen in the left posterior frontal lobe periventricular white matter (series 15, image 30), although this lesion is not corroborated on coronal or sagittal sequences. No increased T2 signal is definitively associated with these possible lesions to suggest edema. Scattered T2 hyperintense signal in the periventricular white matter, likely the sequela of mild chronic small vessel ischemic disease. No foci of hemosiderin deposition to suggest remote hemorrhage. Vascular: Normal flow voids. Skull and upper cervical spine: Normal marrow signal. Sinuses/Orbits: Postsurgical changes in the maxillary sinuses. No acute finding. Other: The mastoids are well aerated. IMPRESSION: Evaluation is somewhat limited by motion artifact. Within this limitation, there are 2 punctate foci of possible enhancement, although these do not appear consistently on all scan planes and no associated edema is seen, suggesting that these lesions are artifactual. Recommend close attention on follow-up. Electronically Signed   By: Merilyn Baba M.D.   On: 02/03/2021 22:46   MR ABDOMEN W WO CONTRAST  Result Date: 01/28/2021 CLINICAL DATA:  Pancreatic lesion seen on recent CT. EXAM: MRI ABDOMEN WITHOUT AND WITH CONTRAST TECHNIQUE: Multiplanar multisequence MR imaging of the abdomen was performed both before and after the administration of intravenous contrast. CONTRAST:  1mL GADAVIST  GADOBUTROL 1 MMOL/ML IV SOLN COMPARISON:  CT scan 06/27/2021 FINDINGS: Lower chest: Unremarkable. Hepatobiliary: 1.7 cm subcapsular lesion identified inferior right liver, demonstrating restricted diffusion and increased T2 signal. After IV contrast administration there is irregular rim enhancement (axial arterial phase image 54/20 and coronal postcontrast 29/31). There is no evidence for gallstones, gallbladder wall thickening, or pericholecystic fluid. No intrahepatic or extrahepatic biliary dilation. Pancreas: As noted on recent CT scan, there is a hypoenhancing lesion in the tail of pancreas demonstrating rim enhancement today. Lesion measures 2.5 x 2.1 cm on postcontrast image 43 of series 24 and the lesion restricts diffusion. Main pancreatic duct appears to abruptly cut off at the margin of this lesion. Spleen:  Splenic volume upper normal.  No focal abnormality. Adrenals/Urinary Tract: No adrenal nodule or mass. Tiny foci of non enhancement in both kidneys are too small to characterize but likely cyst. Stomach/Bowel: Stomach is unremarkable. No gastric wall thickening. No evidence of outlet obstruction. Duodenum is normally positioned as is the ligament of Treitz. No small bowel or  colonic dilatation within the visualized abdomen. Vascular/Lymphatic: No abdominal aortic aneurysm. Ill-defined lymph nodes are seen in the hepatoduodenal ligament measuring up to 11 mm short axis diameter (axial T2 image 17 of series 8). Prominent ill-defined lymph node identified just to the right of the celiac axis on image 18/8 measures 1.6 cm short axis. All of these lymph nodes are strict diffusion on diffusion-weighted imaging. There is marked attenuation of the main portal vein just distal to the portal splenic confluence (axial postcontrast image 40 of series 24 and coronal postcontrast image 23 of series 31). As noted on previous CT, thrombus is identified in the splenic vein. Other:  No intraperitoneal free fluid.  Musculoskeletal: 1.4 cm T1 hypointense, T2 isointense, rim enhancing lesion is identified in the lower thoracic spine at approximately T10. IMPRESSION: 1. 2.5 x 2.1 cm hypoenhancing lesion in the tail of pancreas with peripheral enhancement. Imaging features highly concerning for pancreatic adenocarcinoma. Endoscopic ultrasound would likely prove helpful to further evaluate. 2. Ill-defined lymph nodes in the hepatoduodenal ligament and just to the right of the celiac axis. This lymphadenopathy is associated with marked attenuation of the main portal vein just distal to the portal splenic confluence, presumably secondary to mass-effect/vascular involvement by the lymphadenopathy 3. 1.7 cm subcapsular lesion identified inferior right liver with irregular rim enhancement. Imaging features highly concerning for metastatic disease. 4. 1.4 cm rim enhancing lesion in the lower thoracic spine at approximately T10. Metastatic disease a concern. 5. As noted on prior CT, thrombus is identified in the splenic vein with splenic volume calculated at upper normal. Electronically Signed   By: Misty Stanley M.D.   On: 01/28/2021 13:15   CT ABDOMEN PELVIS W CONTRAST  Result Date: 02/02/2021 CLINICAL DATA:  Acute abdominal pain, suspected pancreatic tail mass EXAM: CT ABDOMEN AND PELVIS WITH CONTRAST TECHNIQUE: Multidetector CT imaging of the abdomen and pelvis was performed using the standard protocol following bolus administration of intravenous contrast. RADIATION DOSE REDUCTION: This exam was performed according to the departmental dose-optimization program which includes automated exposure control, adjustment of the mA and/or kV according to patient size and/or use of iterative reconstruction technique. CONTRAST:  141mL OMNIPAQUE IOHEXOL 300 MG/ML  SOLN COMPARISON:  CT abdomen pelvis, 01/27/2021, MR abdomen, 01/28/2021 FINDINGS: Lower chest: New moderate right, small left pleural effusions and associated atelectasis or  consolidation. Hepatobiliary: No solid liver abnormality is seen. No gallstones, gallbladder wall thickening, or biliary dilatation. Pancreas: Unchanged hypodense lesion of the pancreatic tail, measuring approximately 2.8 x 1.9 cm (series 2, image 27). No pancreatic ductal dilatation or surrounding inflammatory changes. Spleen: Normal in size without significant abnormality. Adrenals/Urinary Tract: Adrenal glands are unremarkable. Kidneys are normal, without renal calculi, solid lesion, or hydronephrosis. Foley catheter in the urinary bladder. Stomach/Bowel: Stomach is within normal limits. No evidence of bowel wall thickening, distention, or inflammatory changes. Vascular/Lymphatic: Aortic atherosclerosis. Redemonstrated thrombus within the central splenic vein and at the confluence of the portal vein (series 2, image 28). The portal vein appears to be partially occluded by celiac axis and hepato duodenal ligament adenopathy, with partial cavernous transformation (series 2, image 25). Largest lymph node conglomerate measures approximately 2.5 x 2.2 cm (series 2, image 25). Reproductive: No mass or other significant abnormality. Other: No abdominal wall hernia or abnormality. New anasarca. New small volume ascites throughout the abdomen and pelvis. Musculoskeletal: No acute or significant osseous findings. IMPRESSION: 1. New small volume ascites, bilateral pleural effusions, and anasarca. 2. Unchanged hypodense lesion of the pancreatic tail, measuring  approximately 2.8 x 1.9 cm. This finding remains highly concerning for pancreatic adenocarcinoma. 3. Celiac axis and hepatoduodenal ligament lymphadenopathy, highly concerning for nodal metastatic disease. 4. Redemonstrated thrombus within the central splenic vein and at the confluence of the portal vein. 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 Aortic Atherosclerosis (ICD10-I70.0). Electronically Signed   By: Delanna Ahmadi M.D.   On: 02/02/2021 17:15   CT Abdomen Pelvis W Contrast  Result Date: 01/27/2021 CLINICAL DATA:  Abdominal pain. EXAM: CT ABDOMEN AND PELVIS WITH CONTRAST TECHNIQUE: Multidetector CT imaging of the abdomen and pelvis was performed using the standard protocol following bolus administration of intravenous contrast. RADIATION DOSE REDUCTION: This exam was performed according to the departmental dose-optimization program which includes automated exposure control, adjustment of the mA and/or kV according to patient size and/or use of iterative reconstruction technique. CONTRAST:  185mL OMNIPAQUE IOHEXOL 300 MG/ML  SOLN COMPARISON:  None. FINDINGS: Lower chest: No acute abnormality. Hepatobiliary: No focal liver abnormality is seen. A 1.7 cm x 1.7 cm x 1.9 cm low-attenuation soft tissue mass is seen within the periportal region (approximately 32.62 Hounsfield units). Mass effect on the portal vein is noted with subsequent marked severity portal vein narrowing (sagittal reformatted images 74 through 78, CT series 5). No gallstones, gallbladder wall thickening, or biliary dilatation. Pancreas: Adjacent 2.6 cm x 1.8 cm x 2.2 cm and 2.8 cm x 1.4 cm x 1.2 cm low-attenuation pancreatic masses are seen within the pancreatic tail (approximately 32.19 Hounsfield units). There is no evidence of pancreatic ductal dilatation. Spleen: Normal in size without focal abnormality. Adrenals/Urinary Tract: Adrenal glands are unremarkable. Kidneys are normal, without renal calculi, focal lesion, or hydronephrosis. Bladder is unremarkable. Stomach/Bowel: Stomach is within normal limits. Appendix appears normal. A large amount of stool is seen throughout the large bowel. This is most prominent within the sigmoid colon. No evidence of bowel wall thickening, distention, or inflammatory changes. Vascular/Lymphatic: Aortic atherosclerosis. Extensive  intraluminal filling defects are seen within the splenic vein. No enlarged abdominal or pelvic lymph nodes. Reproductive: Uterus and bilateral adnexa are unremarkable. Other: No abdominal wall hernia or abnormality. No abdominopelvic ascites. Musculoskeletal: Marked severity degenerative changes are seen at the level of L5-S1. IMPRESSION: 1. Marked amount of splenic vein thrombus. 2. Pancreatic masses, as described above, worrisome for an underlying pancreatic neoplasm. MRI correlation is recommended. 3. Low attenuation periportal lesion concerning for metastatic disease with subsequent marked severity narrowing of the portal vein. 4. Aortic atherosclerosis. 5. Marked severity degenerative changes at the level of L5-S1. Aortic Atherosclerosis (ICD10-I70.0). Electronically Signed   By: Virgina Norfolk M.D.   On: 01/27/2021 20:22   MR 3D Recon At Scanner  Result Date: 01/28/2021 CLINICAL DATA:  Pancreatic lesion seen on recent CT. EXAM: MRI ABDOMEN WITHOUT AND WITH CONTRAST TECHNIQUE: Multiplanar multisequence MR imaging of the abdomen was performed both before and after the administration of intravenous contrast. CONTRAST:  32mL GADAVIST GADOBUTROL 1 MMOL/ML IV SOLN COMPARISON:  CT scan 06/27/2021 FINDINGS: Lower chest: Unremarkable. Hepatobiliary: 1.7 cm subcapsular lesion identified inferior right liver, demonstrating restricted diffusion and increased T2 signal. After IV contrast administration there is irregular rim enhancement (axial arterial phase image 54/20 and coronal postcontrast 29/31). There is no evidence for gallstones, gallbladder wall thickening, or pericholecystic fluid. No intrahepatic or extrahepatic biliary dilation. Pancreas: As noted on recent CT scan, there is a hypoenhancing lesion in the tail  of pancreas demonstrating rim enhancement today. Lesion measures 2.5 x 2.1 cm on postcontrast image 43 of series 24 and the lesion restricts diffusion. Main pancreatic duct appears to abruptly cut  off at the margin of this lesion. Spleen:  Splenic volume upper normal.  No focal abnormality. Adrenals/Urinary Tract: No adrenal nodule or mass. Tiny foci of non enhancement in both kidneys are too small to characterize but likely cyst. Stomach/Bowel: Stomach is unremarkable. No gastric wall thickening. No evidence of outlet obstruction. Duodenum is normally positioned as is the ligament of Treitz. No small bowel or colonic dilatation within the visualized abdomen. Vascular/Lymphatic: No abdominal aortic aneurysm. Ill-defined lymph nodes are seen in the hepatoduodenal ligament measuring up to 11 mm short axis diameter (axial T2 image 17 of series 8). Prominent ill-defined lymph node identified just to the right of the celiac axis on image 18/8 measures 1.6 cm short axis. All of these lymph nodes are strict diffusion on diffusion-weighted imaging. There is marked attenuation of the main portal vein just distal to the portal splenic confluence (axial postcontrast image 40 of series 24 and coronal postcontrast image 23 of series 31). As noted on previous CT, thrombus is identified in the splenic vein. Other:  No intraperitoneal free fluid. Musculoskeletal: 1.4 cm T1 hypointense, T2 isointense, rim enhancing lesion is identified in the lower thoracic spine at approximately T10. IMPRESSION: 1. 2.5 x 2.1 cm hypoenhancing lesion in the tail of pancreas with peripheral enhancement. Imaging features highly concerning for pancreatic adenocarcinoma. Endoscopic ultrasound would likely prove helpful to further evaluate. 2. Ill-defined lymph nodes in the hepatoduodenal ligament and just to the right of the celiac axis. This lymphadenopathy is associated with marked attenuation of the main portal vein just distal to the portal splenic confluence, presumably secondary to mass-effect/vascular involvement by the lymphadenopathy 3. 1.7 cm subcapsular lesion identified inferior right liver with irregular rim enhancement. Imaging  features highly concerning for metastatic disease. 4. 1.4 cm rim enhancing lesion in the lower thoracic spine at approximately T10. Metastatic disease a concern. 5. As noted on prior CT, thrombus is identified in the splenic vein with splenic volume calculated at upper normal. Electronically Signed   By: Misty Stanley M.D.   On: 01/28/2021 13:15   US BIOPSY (LIVER)  Result Date: 01/30/2021 INDICATION: 71 year-old with a pancreatic tail mass and evidence for metastatic disease including a suspicious lesion in the inferior right hepatic lobe. EXAM: ULTRASOUND-GUIDED LIVER LESION BIOPSY MEDICATIONS: Moderate sedation ANESTHESIA/SEDATION: Moderate (conscious) sedation was employed during this procedure. A total of Versed 2.0mg  and fentanyl 100 mcg was administered intravenously at the order of the provider performing the procedure. Total intra-service moderate sedation time: 16 minutes. Patient's level of consciousness and vital signs were monitored continuously by radiology nurse throughout the procedure under the supervision of the provider performing the procedure. FLUOROSCOPY TIME:  None COMPLICATIONS: None immediate. PROCEDURE: Informed written consent was obtained from the patient after a thorough discussion of the procedural risks, benefits and alternatives. All questions were addressed. Maximal Sterile Barrier Technique was utilized including caps, mask, sterile gowns, sterile gloves, sterile drape, hand hygiene and skin antiseptic. A timeout was performed prior to the initiation of the procedure. Right side of the abdomen was evaluated with ultrasound. Hypoechoic lesion in the inferior right hepatic lobe was identified and targeted. Skin was prepped with chlorhexidine and sterile field was created. Skin was anesthetized with 1% lidocaine and a small incision was made. Using ultrasound guidance, a 17 gauge coaxial needle was  directed into the hypoechoic liver lesion. Three core biopsies were obtained with an  18 gauge core device. Specimens placed in formalin. Gel-Foam slurry was injected as the 17 gauge needle was removed. Bandage placed over the puncture site. FINDINGS: Hypoechoic lesion along the inferior right hepatic lobe near the capsule. Lesion measures 1.3 x 1.4 x 1.7 cm. This lesion corresponds with the abnormal lesion on recent MRI. Core biopsy needle was confirmed within the lesion. No immediate bleeding or hematoma formation. IMPRESSION: Ultrasound-guided core biopsy of the right hepatic lesion. Electronically Signed   By: Markus Daft M.D.   On: 01/30/2021 18:32   DG Abd Portable 1V  Result Date: 02/02/2021 CLINICAL DATA:  Abdominal pain and distention. EXAM: PORTABLE ABDOMEN - 1 VIEW COMPARISON:  Abdominal radiograph 02/08/2021. FINDINGS: Gaseous distended loops of small bowel within the central abdomen. Geographic lucencies involving the left upper quadrant and right lower quadrant. Bibasilar atelectasis/scarring. Unremarkable osseous structures. IMPRESSION: Suggestion of dilated small bowel within the central abdomen. There are geographic lucencies within the left upper quadrant and right lower quadrant. While this may represent stool, other etiology such as pneumatosis not excluded. Recommend further evaluation with CT. Electronically Signed   By: Lovey Newcomer M.D.   On: 02/02/2021 13:43   DG Abd Portable 1V  Result Date: 01/31/2021 CLINICAL DATA:  Abdominal distension EXAM: PORTABLE ABDOMEN - 1 VIEW COMPARISON:  None. FINDINGS: The bowel gas pattern is normal. No radio-opaque calculi or other significant radiographic abnormality are seen. IMPRESSION: Negative. Electronically Signed   By: Rolm Baptise M.D.   On: 01/31/2021 18:10     No future appointments.    LOS: 7 days   Attending Note  I personally saw the patient, reviewed the chart and discussed with the patient's family. Patient is still out of it, says yes and no and answers simple questions. Her daughter Joellen Jersey and husband at  bedside. I reviewed pathology findings, staging and treatment options with Ms Joellen Jersey. We did discuss median survival and prognosis with and without treatment. If her mentation improves, and she decides to follow up with medical oncology, we will do rest of the discussion outpatient.  Joellen Jersey thinks mom might not chose chemotherapy but she is not sure about it. We also discussed about hospice if she decides not to do any systemic chemotherapy. Discussed with Joellen Jersey that she should consider genetic testing as well as her siblings.

## 2021-02-04 NOTE — Progress Notes (Signed)
Progress Note    Gloria Ward   IDP:[id number]  DOB: 10/27/1950  DOA: 01/27/2021     7 PCP: Lawerance Cruel, MD  Initial CC: abdominal and back pain, dark stools  Hospital Course: Ms. Gloria Ward is a 71 yo female with PMH anxiety, HTN, Hashimoto's thyroiditis who presented to the hospital with approximately 2-week complaint of intermittent abdominal pain also radiating to her back.  She was also having black tarry stools at home.  She endorsed taking Aleve at essentially max dose for approximately 4 weeks prior to admission and was also using Norco for pain control. CT abdomen/pelvis on admission showed a splenic vein thrombus and pancreatic mass with recommendation for MRI to further correlate. 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 She was admitted for GI evaluation and further work-up regarding underlying lesions.  Interval History:  Patient continued to worsen since yesterday.  She had a second worse episode of hematemesis last night around 8:30 PM.  This morning she was noted to be minimally responsive with shallow breathing.  After discussion with her children bedside, her son reported that he had been pushing her PCA button overnight since she was unable to push it.  Rapid response was called this morning and she was transferred to stepdown.  She remains minimally responsive and nonverbal.  Some response to Narcan although still nonverbal. Goals of care again discussed with her children and for now we will continue trying to reverse her encephalopathy if able while awaiting biopsy results and any further plans.  Assessment & Plan: * Metastatic moderately differentiated adenocarcinoma- (present on admission) - MRI abdomen shows 2.5 x 2.1 cm  hypoenhancing lesion involving tail of pancreas; given the other findings on MRI, concern is for possible primary pancreatic malignancy with metastases - s/p liver biopsy on 01/30/2021; resulted on 02/03/2021 showing moderately differentiated metastatic adenocarcinoma -Oncology following; given rapid decline over past 24-48 hours, she may not be a candidate for treatment soon if at all - greatly appreciate palliative care discussions; as of 1/24, she's now DNR/DNI while we await further family discussions with oncology   Acute metabolic encephalopathy - around 2am on 1/23 patient became less responsive per son; he did endorse to pushing her PCA button due to her appearing in pain but she was unable to push button - rapid response called 1/23 given ongoing decreased mentation and inability to follow commands. Differential includes opioid buildup (s/p 2 doses narcan with RR team) vs stroke (though management would not change) vs severe depression/withdrawn state (though family denies prior severe mood disorders, does have hx anxiety) - she remains awake but non-verbal, not able to interact/follow commands (only looks at you to voice and opens eyes) but not much else purposeful - starting to become restless and develop some signs of pain again this morning so we will have to restart some pain control just in the context of comfort even - check NH3 today, elevated at 68 so will start lactulose enema to see if improves mentation any and continue trending/treating as needed  Abdominal pain - Patient presented with acute onset of abdominal pain.  Multiple possible etiologies including underlying lesion involving the tail of pancreas; patient also has been on daily use of NSAIDs with melanotic stools with risk for ulceration/GIB -Palliative care also consulted on  admission for assistance with pain control and possible further [place] discussions pending underlying malignancy work-up -Some worsening abdominal  distention and pain on 01/31/2021.  She is still having bowel movements.  X-ray obtained and did not show any acute abnormalities (stool burden).  She does have potential for progression of underlying disease process - worsening pain/distension with episode of hematemesis on 1/22; abd xray repeated followed by CT A/P (no overt findings of SBO or ileus, but patient still higher risk)  GIB (gastrointestinal bleeding) - risk for NSAID induced erosions/ulcerations; patient endorses using Aleve at home for approximately 4 weeks; melanotic stools reported on admission  -GI consulted on admission; currently monitoring  - continue PPI for now  - multiple episodes of hematemesis on 1/22. Discontinued heparin drip due to this; still melanotic stools; STAT H/H showed Hgb down to 7.6 g/dL post hematemesis; treated with 1 unit PRBC - continue trending CBC, she continues to have melanotic stools; likely unable to undergo GI intervention at this point, thus continue transfusing as needed/indicated  Hypothyroidism - Patient endorses history of Hashimoto's thyroiditis, managed by her primary care outpatient.  Unable to review any labs - Patient reports self adjustment of Cytomel at home.  States that she has not tolerated T4 replacement in the past -Discussed further with patient today.  Thyroid studies have returned and I reviewed them with her as well as discussed with endocrinology -At this time we will start low-dose levothyroxine and continue Cytomel at lower doses.  She will need repeat thyroid studies outpatient in 4 to 6 weeks depending on prognosis/clinical course - given NPO status, start on IV synthroid in the meantime   Portal vein thrombosis - Found on imaging on admission and confirmed on MRI abdomen.  Likely related to underlying malignancy work-up - s/p hematemesis on 1/22, ongoing melena and now further drop in Hgb - discontinue heparin drip at this time - trend H/H   Essential hypertension -  BP more controlled  - Use labetalol or hydralazine as needed    Old records reviewed in assessment of this patient  Antimicrobials:   DVT prophylaxis: Heparin drip stopped on 1/22 due to hematemesis   Code Status:   Code Status: DNR  Disposition Plan: Home Status is: Inpatient  Objective: Blood pressure (!) 132/42, pulse (!) 117, temperature 98.2 F (36.8 C), temperature source Axillary, resp. rate 17, height 5\' 2"  (1.575 m), weight 84.3 kg, last menstrual period 06/30/2011, SpO2 98 %.  Examination:  Physical Exam Constitutional:      Appearance: Normal appearance.     Comments: Nonverbal, noninteractive. Opens eyes to voice but cannot follow any commands. Appears restless with constant repositioning in bed but no overt distress pain this morning   HENT:     Head: Normocephalic and atraumatic.     Mouth/Throat:     Mouth: Mucous membranes are moist.  Eyes:     Extraocular Movements: Extraocular movements intact.     Comments: Left eye dropping further than prior  Cardiovascular:     Rate and Rhythm: Normal rate and regular rhythm.  Pulmonary:     Effort: Pulmonary effort is normal.     Breath sounds: Normal breath sounds.  Abdominal:     General: Bowel sounds are decreased. There is distension.     Palpations: Abdomen is soft.     Tenderness: There is generalized abdominal tenderness.  Musculoskeletal:        General: Normal range of motion.     Cervical back: Normal range  of motion and neck supple.  Skin:    General: Skin is warm and dry.  Neurological:     Comments: Moving all 4 extremities spontaneously but nonpurposefully. Withdrawals to pain intermittently in her extremities     Consultants:  GI Palliative care Oncology IR  Procedures:  Liver biopsy, 01/30/2021  Data Reviewed: I have personally reviewed labs and imaging studies    LOS: 7 days    Dwyane Dee, MD [facility] 02/04/2021, 1:58 PM

## 2021-02-04 NOTE — TOC Progression Note (Signed)
Transition of Care [facility]) - Progression Note    Patient Details  Name: Gloria Ward MRN: [id number] Date of Birth: [birth year]  Transition of Care [facility]) CM/SW Contact  Leeroy Cha, RN Phone Number: 02/04/2021, 8:24 AM  Clinical Narrative:      Transition of Care [facility]) Screening Note   Patient Details  Name: Gloria Ward Date of Birth: 05-Nov-1950   Transition of Care [facility]) CM/SW Contact:    Leeroy Cha, RN Phone Number: 02/04/2021, 8:24 AM    Transition of Care Department (TOC) has reviewed patient and no TOC needs have been identified at this time. We will continue to monitor patient advancement through interdisciplinary progression rounds. If new patient transition needs arise, please place a TOC consult.         Expected Discharge Plan and Services                                                 Social Determinants of Health (SDOH) Interventions    Readmission Risk Interventions No flowsheet data found.

## 2021-02-04 NOTE — Progress Notes (Signed)
Daily Progress Note   Patient Name: Gloria Ward       Date: 02/04/2021 DOB: Oct 27, 1950  Age: 71 y.o. MRN#: [id number] Attending Physician: Dwyane Dee, MD Primary Care Physician: Lawerance Cruel, MD Admit Date: 01/27/2021 Length of Stay: 7 days  Reason for Consultation/Follow-up: Establishing goals of care and Pain control  HPI/Patient Profile:  71 y.o. female  with past medical history of anxiety, HTN, Hashimoto's thyroiditis who presented to the hospital with approximately 2-week complaint of intermittent abdominal pain also radiating to her back.  She was also having black tarry stools at home.  She endorsed taking Aleve at essentially max dose for approximately 4 weeks prior to admission and was also using Norco for pain control.   CT abdomen/pelvis on admission showed a splenic vein thrombus and pancreatic mass with recommendation for MRI to further correlate.   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   PMT was consulted for ongoing [place] discussions and pain control.  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  She was also noted to have change in mental status and transition to ICU where she was transitioned off of PCA.  Subjective:   Subjective: I saw and examined Gloria Ward.  She was lying in bed moaning and in significant distress secondary to pain.  She would make eye contact and answered yes when asked about pain, but she could not answer more complex questions nor could she participate in conversation.  I met with her son, Gloria Ward, and her daughter, Gloria Ward, joined [name] via phone as well.  We reviewed their understanding of her situation including diagnosis of metastatic pancreatic  cancer.  Gloria Ward reports that she has had significant decline over the past couple of days and family understands severity of her situation.  We discussed her continued moaning and distress and family is clear they do not want her to be suffering.  We discussed concern that her pain may be such that we are either looking at a situation where she is in pain or suffering side effects of confusion/sleepiness.  We also discussed that, at this point, I do not think she is a candidate for any sort of chemotherapy and her candidacy in the future would depend upon her continued nutrition, cognition, and functional status.  Advised that oncology would need to weigh in regarding this and realistic potential of chemotherapy in the future (assuming that is something she would want to pursue).  I was very upfront with family, however, that I think there is a real possibility we are approaching end-of-life with the acute decline she has shown over the past couple of days.  We discussed that in light of discovery of metastatic pancreatic cancer, care should be focused on interventions that are likely to allow Gloria Ward to achieve goals of getting back to home and spending time with family. I discussed with family regarding heroic interventions at the end-of-life and they agree this would not be likely to lead to getting well enough to go back home. They were in agreement with changing CODE STATUS to DO NOT RESUSCITATE.   Objective:   Vital Signs:  BP (!) 132/42    Pulse (!) 117    Temp 98.2 F (36.8 C) (Axillary)    Resp 17  Ht _0  (1.575 m)    Wt 84.3 kg    LMP 06/30/2011    SpO2 98%    BMI 33.99 kg/m   Physical Exam: Lying in bed.  Moaning in distress. Regular work of breathing No edema   Palliative Assessment/Data: 30   Assessment & Plan:   Impression: Present on Admission:  Metastatic moderately differentiated adenocarcinoma  71 year old female admitted with abdominal pain and imaging suggests  likely newly diagnosed metastatic pancreatic cancer.  She is awaiting results of her  biopsy.  At times, she is still having significant pain primarily in the abdomen and buttock/sciatic area (possible nerve root compression from thoracic region?).  He is working pain management and PM&R with goals of care discussions which can be further s/p results.  SUMMARY OF RECOMMENDATIONS   DNR/DNI Continue current care but with focus on pain management.  Discussed goals of maximizing interventions to allow her mental status to improve while also ensuring she is not suffering.  Hope is that these can be balanced, but, if they cannot, family clear that she would want adequate pain management as top priority. Cancer related pain:  Previously on dilaudid PCA but with worsened altered mental status and this was discontinued.  Will rotate to fentanyl 11mg every hour as needed for severe pain.  As noted, pain management is top priority while awaiting input from oncology.  Code Status: DNR  Prognosis: Guarded  Discharge Planning: To Be Determined  Discussed with: Medical team, nursing team, patient  Thank you for allowing [place] participate in the care of [facility] will continue to support holistically.  GMicheline Rough MD [facility] [phone]  Team Phone # [phone]

## 2021-02-05 LAB — HEMOGLOBIN AND HEMATOCRIT, BLOOD
HCT: 24.3 % — ABNORMAL LOW (ref 36.0–46.0)
HCT: 25.2 % — ABNORMAL LOW (ref 36.0–46.0)
Hemoglobin: 7.8 g/dL — ABNORMAL LOW (ref 12.0–15.0)
Hemoglobin: 7.9 g/dL — ABNORMAL LOW (ref 12.0–15.0)

## 2021-02-05 LAB — BASIC METABOLIC PANEL
Anion gap: 8 (ref 5–15)
BUN: 17 mg/dL (ref 8–23)
CO2: 21 mmol/L — ABNORMAL LOW (ref 22–32)
Calcium: 8.5 mg/dL — ABNORMAL LOW (ref 8.9–10.3)
Chloride: 109 mmol/L (ref 98–111)
Creatinine, Ser: 0.79 mg/dL (ref 0.44–1.00)
GFR, Estimated: >60 mL/min (ref >60)
Glucose, Bld: 111 mg/dL — ABNORMAL HIGH (ref 70–99)
Potassium: 3.5 mmol/L (ref 3.5–5.1)
Sodium: 138 mmol/L (ref 135–145)

## 2021-02-05 LAB — CBC
HCT: 23.9 % — ABNORMAL LOW (ref 36.0–46.0)
Hemoglobin: 7.6 g/dL — ABNORMAL LOW (ref 12.0–15.0)
MCH: 26.9 pg (ref 26.0–34.0)
MCHC: 31.8 g/dL (ref 30.0–36.0)
MCV: 84.5 fL (ref 80.0–100.0)
Platelets: 178 10*3/uL (ref 150–400)
RBC: 2.83 MIL/uL — ABNORMAL LOW (ref 3.87–5.11)
RDW: 15.3 % (ref 11.5–15.5)
WBC: 8 10*3/uL (ref 4.0–10.5)
nRBC: 0 % (ref 0.0–0.2)

## 2021-02-05 LAB — AMMONIA
Ammonia: 37 umol/L — ABNORMAL HIGH (ref 9–35)
Ammonia: 35 umol/L (ref 9–35)

## 2021-02-05 LAB — MAGNESIUM: Magnesium: 2 mg/dL (ref 1.7–2.4)

## 2021-02-05 MED ORDER — FENTANYL CITRATE PF 50 MCG/ML IJ SOSY
50.0000 ug | PREFILLED_SYRINGE | INTRAMUSCULAR | Status: AC
  Administered 2021-02-05: 22:00:00 50 ug via INTRAVENOUS

## 2021-02-05 MED ORDER — FENTANYL CITRATE PF 50 MCG/ML IJ SOSY
50.0000 ug | PREFILLED_SYRINGE | INTRAMUSCULAR | Status: DC | PRN
  Administered 2021-02-05 (×5): 50 ug via INTRAVENOUS
  Filled 2021-02-05 (×6): qty 1

## 2021-02-05 MED ORDER — FENTANYL CITRATE PF 50 MCG/ML IJ SOSY: 100.0000 ug | PREFILLED_SYRINGE | INTRAMUSCULAR | Status: DC | PRN

## 2021-02-05 MED ORDER — FENTANYL 25 MCG/HR TD PT72
1.0000 | MEDICATED_PATCH | TRANSDERMAL | Status: DC
  Administered 2021-02-05 – 2021-02-08 (×2): 1 via TRANSDERMAL
  Filled 2021-02-05 (×3): qty 1

## 2021-02-05 MED ORDER — FENTANYL CITRATE PF 50 MCG/ML IJ SOSY
50.0000 ug | PREFILLED_SYRINGE | INTRAMUSCULAR | Status: DC | PRN
  Administered 2021-02-05 – 2021-02-06 (×2): 50 ug via INTRAVENOUS
  Administered 2021-02-06: 06:00:00 100 ug via INTRAVENOUS
  Administered 2021-02-06 (×2): 50 ug via INTRAVENOUS
  Administered 2021-02-06: 03:00:00 100 ug via INTRAVENOUS
  Administered 2021-02-06 – 2021-02-07 (×15): 50 ug via INTRAVENOUS
  Administered 2021-02-08 (×11): 100 ug via INTRAVENOUS
  Filled 2021-02-05: qty 2
  Filled 2021-02-05 (×2): qty 1
  Filled 2021-02-05 (×2): qty 2
  Filled 2021-02-05 (×4): qty 1
  Filled 2021-02-05: qty 2
  Filled 2021-02-05: qty 1
  Filled 2021-02-05: qty 2
  Filled 2021-02-05 (×6): qty 1
  Filled 2021-02-05: qty 2
  Filled 2021-02-05 (×3): qty 1
  Filled 2021-02-05: qty 2
  Filled 2021-02-05 (×3): qty 1
  Filled 2021-02-05: qty 2
  Filled 2021-02-05: qty 1
  Filled 2021-02-05: qty 2
  Filled 2021-02-05: qty 1
  Filled 2021-02-05 (×3): qty 2

## 2021-02-05 MED ORDER — ZOLPIDEM TARTRATE 5 MG PO TABS
5.0000 mg | ORAL_TABLET | Freq: Every evening | ORAL | Status: DC | PRN
  Administered 2021-02-05 – 2021-02-10 (×5): 5 mg via ORAL
  Filled 2021-02-05 (×5): qty 1

## 2021-02-05 MED ORDER — SODIUM CHLORIDE 0.9 % IV SOLN: INTRAVENOUS | Status: AC

## 2021-02-05 NOTE — Progress Notes (Signed)
[facility] PROGRESS NOTE    Progress Note  Gloria Ward  MHW:[id number] DOB: 1950/10/14 DOA: 01/27/2021 PCP: Lawerance Cruel, MD     Brief Narrative:   Gloria Ward is an 71 y.o. 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  With an MRI of the abdomen that showed lesions involving the tail of the pancreas, multiple lymphadenopathy right liver lesion with a lower thoracic spine T10 involvement    Significant studies: 02/03/2021 MRI of the brain showed 2 punctuate of fossae of possible artifactual. 02/02/2021 CT scan of the abdomen and pelvis that showed ascites, bilateral pleural effusion lesion in the pancreatic tail measuring about 3 x 2 cm highly concerning for pancreatic adenocarcinoma multiple lymphadenopathies at the celiac axis and hepatoduodenal ligament.  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  1.7 cm liver lesion and a 1.4 cm enhancing T10 thoracic lesion  Antibiotics: None  Microbiology data: Blood culture:  Procedures: 01/30/2021 liver biopsy Metastatic moderately differentiated adenocarcinoma  Assessment/Plan:   Metastatic moderately differentiated adenocarcinoma: Liver biopsy on 01/30/2021 showed moderately differentiated metastatic adenocarcinoma. Oncology was consulted and due to rapid decline over the past 24 to 48 hours stabilization may not be a candidate for treatment at all. Palliative care was consulted and met with family 02/05/2020 she is now a DNR.    Acute metabolic encephalopathy: Question narcotics.  As she was partially responsive to Narcan. MRI of the brain showed no acute  strokes but it did showed 2 punctuated lesions possible artifactual. She was started on lactulose, ammonia level is pending.  Abdominal pain: Likely due to pancreatic cancer.  Melanotic stools/GI bleed: Was taking NSAIDs at home. GI was consulted, and currently monitoring. She is on PPI which we will continue. She status post multiple episodes of hematemesis, so heparin was discontinued. She is status post 1 unit of packed red blood cells, her hemoglobin this morning 7.6. Continue to check CBCs every 12.   Will allow clear liquid diet. Will ask GI to see as she is awake and stable to see if they will proceed with endoscopic intervention.  Hypothyroidism: Started on low-dose Synthroid and Cytomel.  Will need to have TSH and thyroid function repeated in 4 to 6 weeks. She is currently n.p.o. and receiving IV Synthroid.  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  Essential hypertension: Stable continue labetalol and hydralazine as needed.    DVT prophylaxis: SCD Family Communication:son Status is: Inpatient  Remains inpatient appropriate because: Metastatic adenocarcinoma      Code Status:     Code Status Orders  (From admission, onward)           Start     Ordered   02/04/21 1334  Do not attempt resuscitation (DNR)  Continuous       Question Answer Comment  In the event of cardiac or respiratory ARREST Do not call a code blue   In the event of cardiac or respiratory ARREST Do not perform Intubation, CPR, defibrillation or ACLS   In the event of cardiac or respiratory ARREST Use medication by any route, position, wound care, and other measures  to relive pain and suffering. May use oxygen, suction and manual treatment of airway obstruction as needed for comfort.      02/04/21 1333           Code Status History     Date Active Date Inactive Code Status Order ID Comments User Context   01/28/2021 0415  02/04/2021 1333 Full Code [id number]  Chotiner, Yevonne Aline, MD ED         IV Access:   Peripheral IV   Procedures and diagnostic studies:   MR BRAIN W WO CONTRAST  Result Date: 02/03/2021 CLINICAL DATA:  Brain metastases suspected EXAM: MRI HEAD WITHOUT AND WITH CONTRAST TECHNIQUE: Multiplanar, multiecho pulse sequences of the brain and surrounding structures were obtained without and with intravenous contrast. CONTRAST:  52m GADAVIST GADOBUTROL 1 MMOL/ML IV SOLN COMPARISON:  No prior MRI of the head. FINDINGS: Evaluation is somewhat limited by motion artifact. Brain: No restricted diffusion to suggest acute or subacute infarct. No acute hemorrhage, mass, mass effect, or midline shift. Possible focus of enhancement in the right periventricular white matter (series 16, image 15 and series 17, image 10), which is not definitively seen on the axial postcontrast T1 and may be artifactual. Additional possible punctate lesion is seen in the left posterior frontal lobe periventricular white matter (series 15, image 30), although this lesion is not corroborated on coronal or sagittal sequences. No increased T2 signal is definitively associated with these possible lesions to suggest edema. Scattered T2 hyperintense signal in the periventricular white matter, likely the sequela of mild chronic small vessel ischemic disease. No foci of hemosiderin deposition to suggest remote hemorrhage. Vascular: Normal flow voids. Skull and upper cervical spine: Normal marrow signal. Sinuses/Orbits: Postsurgical changes in the maxillary sinuses. No acute finding. Other: The mastoids are well aerated. IMPRESSION: Evaluation is somewhat limited by motion artifact. Within this limitation, there are 2 punctate foci of possible enhancement, although these do not appear consistently on all scan planes and no associated edema is seen, suggesting that these lesions are artifactual. Recommend close attention on follow-up. Electronically  Signed   By: AMerilyn BabaM.D.   On: 02/03/2021 22:46     Medical Consultants:   None.   Subjective:    Gloria LINENBERGERfeels much better she relates she wants to come off and is hungry will also like to get out of bed.  Objective:    Vitals:   02/05/21 0550 02/05/21 0600 02/05/21 0635 02/05/21 0637  BP:  (!) 147/68    Pulse: 100 98 97 97  Resp: 15 18 20 17   Temp:      TempSrc:      SpO2: 100% 100% 100% 96%  Weight:      Height:       SpO2: 96 % O2 Flow Rate (L/min): 2 L/min FiO2 (%): 21 %   Intake/Output Summary (Last 24 hours) at 02/05/2021 0751 Last data filed at 02/05/2021 0630 Gross per 24 hour  Intake 2201.46 ml  Output 1950 ml  Net 251.46 ml   Filed Weights   01/27/21 1822 01/28/21 1956 02/03/21 1200  Weight: 77.1 kg 75.7 kg 84.3 kg    Exam: General exam: In no acute distress. Respiratory system: Good air movement and clear to auscultation. Cardiovascular system: S1 & S2 heard, RRR. No JVD. Gastrointestinal system: Abdomen is nondistended, soft and nontender.  Extremities: No pedal edema. Skin: No rashes, lesions or ulcers Psychiatry: Judgement and insight appear normal. Mood & affect appropriate.  Data Reviewed:    Labs: Basic Metabolic Panel: Recent Labs  Lab 02/01/21 0539 02/02/21 0532 02/03/21 0526 02/04/21 0244 02/05/21 0547  NA 133* 132* 132* 136 138  K 3.9 4.0 3.9 3.4* 3.5  CL 99 96* 97* 104 109  CO2 28 29 30 24  21*  GLUCOSE 109* 134* 164* 118* 111*  BUN 11 17 25* 20 17  CREATININE 0.62 0.71 0.70 0.62 0.79  CALCIUM 8.7* 8.5* 8.2* 8.4* 8.5*  MG 2.1 2.2 2.4 2.3 2.0   GFR Estimated Creatinine Clearance: 67.8 mL/min (by C-G formula based on SCr of 0.79 mg/dL). Liver Function Tests: Recent Labs  Lab 02/04/21 0901  AST 29  ALT 24  ALKPHOS 53  BILITOT 0.6  PROT 5.1*  ALBUMIN 2.6*   No results for input(s): LIPASE, AMYLASE in the last 168 hours. Recent Labs  Lab 02/04/21 0901 02/05/21 0547  AMMONIA 68* 37*    Coagulation profile No results for input(s): INR, PROTIME in the last 168 hours. COVID-19 Labs  No results for input(s): DDIMER, FERRITIN, LDH, CRP in the last 72 hours.  Lab Results  Component Value Date   [place] NEGATIVE 01/28/2021    CBC: Recent Labs  Lab 02/02/21 0532 02/02/21 1233 02/03/21 0526 02/03/21 1218 02/04/21 0244 02/04/21 1148 02/04/21 1747 02/05/21 0011 02/05/21 0547  WBC 9.3  --  8.8 10.6* 8.6  --   --   --  8.0  NEUTROABS  --   --   --  8.2*  --   --   --   --   --   HGB 8.7*   < > 7.8* 8.4* 7.7* 8.1* 7.7* 7.8* 7.6*  HCT 27.5*   < > 23.9* 26.1* 24.0* 24.3* 24.3* 25.2* 23.9*  MCV 84.1  --  82.7 82.9 83.3  --   --   --  84.5  PLT 148*  --  162 191 177  --   --   --  178   < > = values in this interval not displayed.   Cardiac Enzymes: No results for input(s): CKTOTAL, CKMB, CKMBINDEX, TROPONINI in the last 168 hours. BNP (last 3 results) No results for input(s): PROBNP in the last 8760 hours. CBG: No results for input(s): GLUCAP in the last 168 hours. D-Dimer: No results for input(s): DDIMER in the last 72 hours. Hgb A1c: No results for input(s): HGBA1C in the last 72 hours. Lipid Profile: No results for input(s): CHOL, HDL, LDLCALC, TRIG, CHOLHDL, LDLDIRECT in the last 72 hours. Thyroid function studies: No results for input(s): TSH, T4TOTAL, T3FREE, THYROIDAB in the last 72 hours.  Invalid input(s): FREET3 Anemia work up: No results for input(s): VITAMINB12, FOLATE, FERRITIN, TIBC, IRON, RETICCTPCT in the last 72 hours. Sepsis Labs: Recent Labs  Lab 02/03/21 0526 02/03/21 1218 02/04/21 0244 02/05/21 0547  WBC 8.8 10.6* 8.6 8.0   Microbiology Recent Results (from the past 240 hour(s))  Resp Panel by RT-PCR (Flu A&B, Covid) Nasopharyngeal Swab     Status: None   Collection Time: 01/28/21  3:15 AM   Specimen: Nasopharyngeal Swab; Nasopharyngeal(NP) swabs in vial transport medium  Result Value Ref Range Status   SARS Coronavirus 2  by RT PCR NEGATIVE NEGATIVE Final    Comment: (NOTE) SARS-CoV-2 target nucleic acids are NOT DETECTED.  The SARS-CoV-2 RNA is generally detectable in upper respiratory specimens during the acute phase of infection. The lowest concentration of SARS-CoV-2 viral copies this assay can detect is 138 copies/mL. A negative result does not preclude  SARS-Cov-2 infection and should not be used as the sole basis for treatment or other patient management decisions. A negative result may occur with  improper specimen collection/handling, submission of specimen other than nasopharyngeal swab, presence of viral mutation(s) within the areas targeted by this assay, and inadequate number of viral copies(<138 copies/mL). A negative result must be combined with clinical observations, patient history, and epidemiological information. The expected result is Negative.  Fact Sheet for Patients:  EntrepreneurPulse.com.au  Fact Sheet for Healthcare Providers:  IncredibleEmployment.be  This test is no t yet approved or cleared by the [place] FDA and  has been authorized for detection and/or diagnosis of SARS-CoV-2 by FDA under an Emergency Use Authorization (EUA). This EUA will remain  in effect (meaning this test can be used) for the duration of the COVID-19 declaration under Section 564(b)(1) of the Act, 21 U.S.C.section 360bbb-3(b)(1), unless the authorization is terminated  or revoked sooner.       Influenza A by PCR NEGATIVE NEGATIVE Final   Influenza B by PCR NEGATIVE NEGATIVE Final    Comment: (NOTE) The Xpert Xpress SARS-CoV-2/FLU/RSV plus assay is intended as an aid in the diagnosis of influenza from Nasopharyngeal swab specimens and should not be used as a sole basis for treatment. Nasal washings and aspirates are unacceptable for Xpert Xpress SARS-CoV-2/FLU/RSV testing.  Fact Sheet for Patients: EntrepreneurPulse.com.au  Fact  Sheet for Healthcare Providers: IncredibleEmployment.be  This test is not yet approved or cleared by the [place] FDA and has been authorized for detection and/or diagnosis of SARS-CoV-2 by FDA under an Emergency Use Authorization (EUA). This EUA will remain in effect (meaning this test can be used) for the duration of the COVID-19 declaration under Section 564(b)(1) of the Act, 21 U.S.C. section 360bbb-3(b)(1), unless the authorization is terminated or revoked.  Performed at [facility], [place] [address] [place], [place] [address]   MRSA Next Gen by PCR, Nasal     Status: None   Collection Time: 02/03/21  8:00 PM   Specimen: Nasal Mucosa; Nasal Swab  Result Value Ref Range Status   MRSA by PCR Next Gen NOT DETECTED NOT DETECTED Final    Comment: (NOTE) The GeneXpert MRSA Assay (FDA approved for NASAL specimens only), is one component of a comprehensive MRSA colonization surveillance program. It is not intended to diagnose MRSA infection nor to guide or monitor treatment for MRSA infections. Test performance is not FDA approved in patients less than 58 years old. Performed at [facility], [place] [address] [place], [place] [address]      Medications:    sodium chloride   Intravenous Once   chlorhexidine  15 mL Mouth Rinse BID   Chlorhexidine Gluconate Cloth  6 each Topical Daily   levothyroxine  25 mcg Intravenous Daily   mouth rinse  15 mL Mouth Rinse q12n4p   pantoprazole (PROTONIX) IV  40 mg Intravenous Q12H   Continuous Infusions:  sodium chloride 75 mL/hr at 02/05/21 0500   ciprofloxacin Stopped (02/05/21 0630)   metronidazole 500 mg (02/05/21 0749)      LOS: 8 days   Charlynne Cousins  [facility]  02/05/2021, 7:51 AM

## 2021-02-05 NOTE — Progress Notes (Signed)
[facility] Gastroenterology Progress Note  Gloria Ward 71 y.o. Oct 13, 1950  CC: Metastatic pancreatic carcinoma   Subjective: Patient is back to baseline today, alert and oriented.  Cooperating.  Denies abdominal pain.  Denies further episodes of hematemesis.  Last episode of hematemesis was 1/19.  GI was asked to evaluate need for EGD.  ROS : Review of Systems  Gastrointestinal:  Negative for abdominal pain, heartburn, nausea and vomiting.     Objective: Vital signs in last 24 hours: Vitals:   02/05/21 0900 02/05/21 1111  BP: (!) 150/83 133/71  Pulse: 97 81  Resp: 20 16  Temp:  (!) 97.5 F (36.4 C)  SpO2: 99% 99%    Physical Exam:  General:  Alert, cooperative, no distress, appears stated age  Head:  Normocephalic, without obvious abnormality, atraumatic  Eyes:  Anicteric sclera, EOM's intact  Lungs:   Clear to auscultation bilaterally, respirations unlabored  Heart:  Regular rate and rhythm, S1, S2 normal  Abdomen:   Soft, non-tender, bowel sounds active all four quadrants,  no masses,     Lab Results: Recent Labs    02/04/21 0244 02/05/21 0547  NA 136 138  K 3.4* 3.5  CL 104 109  CO2 24 21*  GLUCOSE 118* 111*  BUN 20 17  CREATININE 0.62 0.79  CALCIUM 8.4* 8.5*  MG 2.3 2.0   Recent Labs    02/04/21 0901  AST 29  ALT 24  ALKPHOS 53  BILITOT 0.6  PROT 5.1*  ALBUMIN 2.6*   Recent Labs    02/03/21 1218 02/04/21 0244 02/04/21 1148 02/05/21 0011 02/05/21 0547  WBC 10.6* 8.6  --   --  8.0  NEUTROABS 8.2*  --   --   --   --   HGB 8.4* 7.7*   < > 7.8* 7.6*  HCT 26.1* 24.0*   < > 25.2* 23.9*  MCV 82.9 83.3  --   --  84.5  PLT 191 177  --   --  178   < > = values in this interval not displayed.   No results for input(s): LABPROT, INR in the last 72 hours.    Assessment Metastatic pancreatic cancer - abdominal xray suggested dilation of small bowel - CT ab/pelvis with contrast: new small volume ascites, bilateral pleural effusions, and anasarca. No  bowel wall thickening or distention. - biopsy results still pending. - Elevated CA 19-9: 3,609 - Liver biopsy: metastatic moderately differentiated adenoma carcinoma -BUN 17, creatinine 0.79 -Hgb 7.6, stable   Plan: Discussed with patient and daughter option of doing EGD to evaluate for gastritis, esophagitis, PUD.  I thoroughly discussed the procedure with the patient (at bedside) to include nature of the procedure, alternatives, benefits, and risks (including but not limited to bleeding, infection, perforation, anesthesia/cardiac pulmonary complications). patient has had no further hematemesis.  Hemoglobin remained stable and BUN is not elevated.  Patient and daughter stated they would like to think about it before proceeding with EGD.  At this time patient appears stable enough to be a candidate for EGD, this was not the case earlier in the week as patient was previously disoriented. Continue supportive care Continue Protonix 40 mg IV twice daily [facility] will follow  Garnette Scheuermann PA-C 02/05/2021, 11:47 AM  Contact #  [phone]

## 2021-02-05 NOTE — Progress Notes (Signed)
Daily Progress Note   Patient Name: Gloria Ward       Date: 02/05/2021 DOB: 1950/07/10  Age: 71 y.o. MRN#: [id number] Attending Physician: Charlynne Cousins, MD Primary Care Physician: Lawerance Cruel, MD Admit Date: 01/27/2021 Length of Stay: 8 days  Reason for Consultation/Follow-up: Establishing goals of care and Pain control  HPI/Patient Profile:  71 y.o. female  with past medical history of anxiety, HTN, Hashimoto's thyroiditis who presented to the hospital with approximately 2-week complaint of intermittent abdominal pain also radiating to her back.  She was also having black tarry stools at home.  She endorsed taking Aleve at essentially max dose for approximately 4 weeks prior to admission and was also using Norco for pain control.   CT abdomen/pelvis on admission showed a splenic vein thrombus and pancreatic mass with recommendation for MRI to further correlate.   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   PMT was consulted for ongoing [place] discussions and pain control.  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  She was also noted to have change in mental status and transition to ICU where she was transitioned off of PCA.  Subjective:   Subjective: Met with patient, her daughter, her son, and her daughter in law.  We discussed clinical course as well as wishes moving forward in regard to care plan this hospitalization.  Concepts specific to code status and rehospitalization discussed.  We discussed difference between a aggressive medical intervention path and a palliative, comfort focused care path.  Values and goals of care important to patient and family were attempted to be  elicited.   Questions and concerns addressed.   PMT will continue to support holistically.  Objective:   Vital Signs:  BP (!) 151/69    Pulse 93    Temp 97.8 F (36.6 C) (Oral)    Resp 17    Ht 5' 2"  (1.575 m)    Wt 84.3 kg    LMP 06/30/2011    SpO2 100%    BMI 33.99 kg/m   Physical Exam: Lying in bed.  Moaning in distress. Regular work of breathing No edema   Palliative Assessment/Data: 30   Assessment & Plan:   Impression: Present on Admission:  Metastatic moderately differentiated adenocarcinoma  71 year old female admitted with abdominal pain and imaging suggests likely newly diagnosed metastatic pancreatic cancer.  She is awaiting results of her  biopsy.  At times, she is still having significant pain primarily in the abdomen and buttock/sciatic area (possible nerve root compression from thoracic region?).  He is working pain management and PM&R with goals of care discussions which can be further s/p results.  SUMMARY OF RECOMMENDATIONS   DNR/DNI Continue current care but with focus on pain management as top priority. Discussed risk vs benefit of EGD.  She is very concerned about sedation with propofol (bad reaction in the past) and we discussed desire to forgo EGD at this point. Cancer related pain:  Initially continued fentanyl 78mg every hour as needed for severe pain and added fentanyl patch at 274m/hr.  Received call from her son that pain was uncontrolled and I increased this to fentanyl [id number] 1 hour as needed.  Code Status: DNR  Prognosis: Guarded  Discharge Planning: To Be Determined  Discussed with: Medical team, nursing team, patient  Thank you for allowing [name] to participate in the care of Gloria Ward PMT will continue to support holistically.  Micheline Rough, MD [facility] Team [phone]   Team Phone # [phone]

## 2021-02-06 ENCOUNTER — Encounter (HOSPITAL_COMMUNITY)
Admission: EM | Disposition: A | Discharge status: Hospice | Payer: Self-pay | Source: Home / Self Care | Attending: Internal Medicine

## 2021-02-06 DIAGNOSIS — G9341 Metabolic encephalopathy: Secondary | ICD-10-CM

## 2021-02-06 DIAGNOSIS — I10 Essential (primary) hypertension: Secondary | ICD-10-CM

## 2021-02-06 LAB — BASIC METABOLIC PANEL
Anion gap: 6 (ref 5–15)
BUN: 12 mg/dL (ref 8–23)
CO2: 17 mmol/L — ABNORMAL LOW (ref 22–32)
Calcium: 7.8 mg/dL — ABNORMAL LOW (ref 8.9–10.3)
Chloride: 110 mmol/L (ref 98–111)
Creatinine, Ser: 0.7 mg/dL (ref 0.44–1.00)
GFR, Estimated: >60 mL/min (ref >60)
Glucose, Bld: 111 mg/dL — ABNORMAL HIGH (ref 70–99)
Potassium: 3.4 mmol/L — ABNORMAL LOW (ref 3.5–5.1)
Sodium: 133 mmol/L — ABNORMAL LOW (ref 135–145)

## 2021-02-06 LAB — HEMOGLOBIN AND HEMATOCRIT, BLOOD
HCT: 22.1 % — ABNORMAL LOW (ref 36.0–46.0)
HCT: 33.7 % — ABNORMAL LOW (ref 36.0–46.0)
Hemoglobin: 6.8 g/dL — CL (ref 12.0–15.0)
Hemoglobin: 10.8 g/dL — ABNORMAL LOW (ref 12.0–15.0)

## 2021-02-06 LAB — PREPARE RBC (CROSSMATCH)

## 2021-02-06 LAB — MAGNESIUM: Magnesium: 1.6 mg/dL — ABNORMAL LOW (ref 1.7–2.4)

## 2021-02-06 SURGERY — EGD (ESOPHAGOGASTRODUODENOSCOPY)
Anesthesia: Monitor Anesthesia Care

## 2021-02-06 MED ORDER — FENTANYL 25 MCG/HR TD PT72
1.0000 | MEDICATED_PATCH | TRANSDERMAL | Status: DC
  Administered 2021-02-06 – 2021-02-08 (×2): 1 via TRANSDERMAL
  Filled 2021-02-06 (×2): qty 1

## 2021-02-06 MED ORDER — LIOTHYRONINE SODIUM 25 MCG PO TABS
25.0000 ug | ORAL_TABLET | Freq: Every day | ORAL | Status: DC
  Administered 2021-02-06 – 2021-02-07 (×2): 25 ug via ORAL
  Filled 2021-02-06 (×3): qty 1

## 2021-02-06 MED ORDER — SODIUM CHLORIDE 0.9 % IV SOLN
8.0000 mg | INTRAVENOUS | Status: DC | PRN
  Filled 2021-02-06: qty 4

## 2021-02-06 MED ORDER — PROCHLORPERAZINE EDISYLATE 10 MG/2ML IJ SOLN
10.0000 mg | Freq: Four times a day (QID) | INTRAMUSCULAR | Status: DC | PRN
  Administered 2021-02-06 – 2021-02-09 (×9): 10 mg via INTRAVENOUS
  Filled 2021-02-06 (×9): qty 2

## 2021-02-06 MED ORDER — LEVOTHYROXINE SODIUM 25 MCG PO TABS
25.0000 ug | ORAL_TABLET | Freq: Every day | ORAL | Status: DC
  Filled 2021-02-06: qty 1

## 2021-02-06 MED ORDER — SODIUM CHLORIDE 0.9% IV SOLUTION: Freq: Once | INTRAVENOUS | Status: AC

## 2021-02-06 MED ORDER — BISACODYL 5 MG PO TBEC
5.0000 mg | DELAYED_RELEASE_TABLET | Freq: Every day | ORAL | Status: DC | PRN
  Administered 2021-02-06 – 2021-02-07 (×3): 5 mg via ORAL
  Filled 2021-02-06 (×4): qty 1

## 2021-02-06 MED ORDER — SODIUM CHLORIDE 0.9% IV SOLUTION
Freq: Once | INTRAVENOUS | Status: AC
Start: 2021-02-06 — End: 2021-02-06

## 2021-02-06 MED ORDER — POLYETHYLENE GLYCOL 3350 17 G PO PACK
17.0000 g | PACK | Freq: Two times a day (BID) | ORAL | Status: AC
  Administered 2021-02-07 – 2021-02-08 (×3): 17 g via ORAL
  Filled 2021-02-06 (×4): qty 1

## 2021-02-06 MED ORDER — ONDANSETRON HCL 4 MG PO TABS
4.0000 mg | ORAL_TABLET | ORAL | Status: DC | PRN
  Administered 2021-02-06 – 2021-02-08 (×3): 4 mg via ORAL
  Filled 2021-02-06 (×3): qty 1

## 2021-02-06 NOTE — Progress Notes (Signed)
Date and time results received: 02/06/21 0525  Test: CBC Critical Value: Hgb 6.8  Name of Provider Notified: Clarene Essex NP  Orders Received? Or Actions Taken?: Actions Taken: Orders in for RBCs and ABO

## 2021-02-06 NOTE — Progress Notes (Signed)
[facility] Gastroenterology Progress Note  Gloria Ward 71 y.o. 06/13/1950  CC:  Metastatic pancreatic carcinoma   Subjective: Patient and family have decided to decline EGD due to concerns of propofol sedation. Patient states she is feeling good today. Denies nausea, vomiting, denies abdominal pain.  ROS : Review of Systems  Gastrointestinal:  Negative for abdominal pain, blood in stool, constipation, diarrhea, heartburn, melena, nausea and vomiting.  Genitourinary:  Negative for dysuria and urgency.     Objective: Vital signs in last 24 hours: Vitals:   02/06/21 0837 02/06/21 0838  BP: 135/69 135/69  Pulse: 95 95  Resp: 18 18  Temp: 98 F (36.7 C) 98 F (36.7 C)  SpO2:  100%    Physical Exam:  General:  Alert, cooperative, no distress, appears stated age  Head:  Normocephalic, without obvious abnormality, atraumatic  Eyes:  Anicteric sclera, EOM's intact  Lungs:   Clear to auscultation bilaterally, respirations unlabored  Heart:  Regular rate and rhythm, S1, S2 normal  Abdomen:   Soft, non-tender, distended. bowel sounds active all four quadrants,  no masses,     Lab Results: Recent Labs    02/05/21 0547 02/06/21 0459  NA 138 133*  K 3.5 3.4*  CL 109 110  CO2 21* 17*  GLUCOSE 111* 111*  BUN 17 12  CREATININE 0.79 0.70  CALCIUM 8.5* 7.8*  MG 2.0 1.6*   Recent Labs    02/04/21 0901  AST 29  ALT 24  ALKPHOS 53  BILITOT 0.6  PROT 5.1*  ALBUMIN 2.6*   Recent Labs    02/03/21 1218 02/04/21 0244 02/04/21 1148 02/05/21 0547 02/05/21 1734 02/06/21 0459  WBC 10.6* 8.6  --  8.0  --   --   NEUTROABS 8.2*  --   --   --   --   --   HGB 8.4* 7.7*   < > 7.6* 7.9* 6.8*  HCT 26.1* 24.0*   < > 23.9* 24.3* 22.1*  MCV 82.9 83.3  --  84.5  --   --   PLT 191 177  --  178  --   --    < > = values in this interval not displayed.   No results for input(s): LABPROT, INR in the last 72 hours.    Assessment Metastatic pancreatic cancer - abdominal xray suggested  dilation of small bowel - CT ab/pelvis with contrast: new small volume ascites, bilateral pleural effusions, and anasarca. No bowel wall thickening or distention. - biopsy results still pending. - Elevated CA 19-9: 3,609 - Liver biopsy: metastatic moderately differentiated adenoma carcinoma -BUN 12, creatinine 0.70 - hgb 6.8   Plan: Patient and family declined EGD. Will treat conservatively. Suspect if unable to do EGD, patient will continue to have drop in hgb. Continue supportive care Continue Protonix 40 mg IV twice daily [facility] will follow.  Garnette Scheuermann PA-C 02/06/2021, 11:34 AM  Contact #  [phone]

## 2021-02-06 NOTE — H&P (View-Only) (Signed)
[facility] Gastroenterology Progress Note  Gloria Ward 71 y.o. 1950/02/22  CC:  Metastatic pancreatic carcinoma   Subjective: Patient and family have decided to decline EGD due to concerns of propofol sedation. Patient states she is feeling good today. Denies nausea, vomiting, denies abdominal pain.  ROS : Review of Systems  Gastrointestinal:  Negative for abdominal pain, blood in stool, constipation, diarrhea, heartburn, melena, nausea and vomiting.  Genitourinary:  Negative for dysuria and urgency.     Objective: Vital signs in last 24 hours: Vitals:   02/06/21 0837 02/06/21 0838  BP: 135/69 135/69  Pulse: 95 95  Resp: 18 18  Temp: 98 F (36.7 C) 98 F (36.7 C)  SpO2:  100%    Physical Exam:  General:  Alert, cooperative, no distress, appears stated age  Head:  Normocephalic, without obvious abnormality, atraumatic  Eyes:  Anicteric sclera, EOM's intact  Lungs:   Clear to auscultation bilaterally, respirations unlabored  Heart:  Regular rate and rhythm, S1, S2 normal  Abdomen:   Soft, non-tender, distended. bowel sounds active all four quadrants,  no masses,     Lab Results: Recent Labs    02/05/21 0547 02/06/21 0459  NA 138 133*  K 3.5 3.4*  CL 109 110  CO2 21* 17*  GLUCOSE 111* 111*  BUN 17 12  CREATININE 0.79 0.70  CALCIUM 8.5* 7.8*  MG 2.0 1.6*   Recent Labs    02/04/21 0901  AST 29  ALT 24  ALKPHOS 53  BILITOT 0.6  PROT 5.1*  ALBUMIN 2.6*   Recent Labs    02/03/21 1218 02/04/21 0244 02/04/21 1148 02/05/21 0547 02/05/21 1734 02/06/21 0459  WBC 10.6* 8.6  --  8.0  --   --   NEUTROABS 8.2*  --   --   --   --   --   HGB 8.4* 7.7*   < > 7.6* 7.9* 6.8*  HCT 26.1* 24.0*   < > 23.9* 24.3* 22.1*  MCV 82.9 83.3  --  84.5  --   --   PLT 191 177  --  178  --   --    < > = values in this interval not displayed.   No results for input(s): LABPROT, INR in the last 72 hours.    Assessment Metastatic pancreatic cancer - abdominal xray suggested  dilation of small bowel - CT ab/pelvis with contrast: new small volume ascites, bilateral pleural effusions, and anasarca. No bowel wall thickening or distention. - biopsy results still pending. - Elevated CA 19-9: 3,609 - Liver biopsy: metastatic moderately differentiated adenoma carcinoma -BUN 12, creatinine 0.70 - hgb 6.8   Plan: Patient and family declined EGD. Will treat conservatively. Suspect if unable to do EGD, patient will continue to have drop in hgb. Continue supportive care Continue Protonix 40 mg IV twice daily [facility] will follow.  Garnette Scheuermann PA-C 02/06/2021, 11:34 AM  Contact #  [phone]

## 2021-02-06 NOTE — Progress Notes (Signed)
[facility] PROGRESS NOTE    Progress Note  Gloria Ward  ZMO:[id number] DOB: 10/10/1950 DOA: 01/27/2021 PCP: Gloria Cruel, MD     Brief Narrative:   Gloria Ward is an 71 y.o. female past medical history essential hypertension, Hashimoto presents with 2 weeks of intermittent abdominal Ward radiating to her back, with black tarry stools in the setting of only taking Aleve for about 4 weeks prior to admission, CT of the abdomen shows splenic vein thrombosis, pancreatic mass.  With an MRI of the abdomen that showed lesions involving the tail of the pancreas, multiple lymphadenopathy right liver lesion with a lower thoracic spine T10 involvement   Significant studies: 02/03/2021 MRI of the brain showed 2 punctuate of fossae of possible artifactual. 02/02/2021 CT scan of the abdomen and pelvis that showed ascites, bilateral pleural effusion lesion in the pancreatic tail measuring about 3 x 2 cm highly concerning for pancreatic adenocarcinoma multiple lymphadenopathies at the celiac axis and hepatoduodenal ligament.  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  1.7 cm liver lesion and a 1.4 cm enhancing T10 thoracic lesion  Antibiotics: None  Microbiology data: Blood culture:  Procedures: 01/30/2021 liver biopsy Metastatic moderately differentiated adenocarcinoma  Assessment/Plan:   Metastatic moderately differentiated adenocarcinoma: Liver biopsy on 01/30/2021 showed moderately differentiated metastatic adenocarcinoma. Oncology was consulted and recommended follow-up with them as an outpatient. Palliative care was consulted and met with family 02/05/2020 she is now a DNR.    Acute metabolic encephalopathy: Likely due to narcotic use..  As she was partially responsive to Narcan. MRI of the brain showed no acute strokes but it did showed 2 punctuated lesions  possible artifactual. She was started on lactulose, ammonia level has normalized.  We will try to wean off lactulose.  Abdominal Ward: Likely due to pancreatic cancer. Currently on IV fentanyl and a patch.  She relates her Ward is controlled  Melanotic stools/GI bleed/acute blood loss anemia: Was taking NSAIDs at home. Continue PPI. Her hemoglobin continues to trickle down this morning 6.8 we will transfuse 2 units of packed red blood cells. She is currently on a clear liquid diet. GI evaluated the patient and discussed EGD the patient is still unsure whether to proceed with EGD, she remains of Lovenox.   She does know she is hungry and she would like to eat I have explained to her the risk and benefits and she understands she would like to proceed with a diet.  Hypothyroidism: Started on low-dose Synthroid and Cytomel.  Will need to have TSH and thyroid function repeated in 4 to 6 weeks. Transition IV Synthroid to oral.  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 Due to her ongoing GI bleed, hemoglobin this morning 6.8 see above for further details.  Essential hypertension: Stable continue labetalol and hydralazine as needed.    DVT prophylaxis: SCD's Family Communication:son Status is: Inpatient  Remains inpatient appropriate because: Metastatic adenocarcinoma      Code Status:     Code Status Orders  (From admission, onward)           Start     Ordered   02/04/21 1334  Do not attempt resuscitation (DNR)  Continuous       Question Answer Comment  In the event of cardiac or respiratory ARREST Do not call a code blue   In the  event of cardiac or respiratory ARREST Do not perform Intubation, CPR, defibrillation or ACLS   In the event of cardiac or respiratory ARREST Use medication by any route, position, wound care, and other measures to relive Ward and suffering. May use oxygen, suction and manual treatment  of airway obstruction as needed for comfort.      02/04/21 1333           Code Status History     Date Active Date Inactive Code Status Order ID Comments User Context   01/28/2021 0415 02/04/2021 1333 Full Code [id number]  Chotiner, Yevonne Aline, MD ED         IV Access:   Peripheral IV   Procedures and diagnostic studies:   No results found.   Medical Consultants:   None.   Subjective:    Gloria Ward is controlled she would like to eat.  Objective:    Vitals:   02/05/21 1755 02/05/21 1945 02/05/21 2321 02/06/21 0436  BP:  (!) 151/69 119/75 139/83  Pulse:  93 99 97  Resp:  17  18  Temp:  97.8 F (36.6 C) 98.1 F (36.7 C) 97.7 F (36.5 C)  TempSrc:  Oral Axillary Oral  SpO2: 95% 100% 100% 100%  Weight:      Height:       SpO2: 100 % O2 Flow Rate (L/min): 2 L/min FiO2 (%): 21 %   Intake/Output Summary (Last 24 hours) at 02/06/2021 0819 Last data filed at 02/06/2021 0800 Gross per 24 hour  Intake 931 ml  Output 750 ml  Net 181 ml    Filed Weights   01/27/21 1822 01/28/21 1956 02/03/21 1200  Weight: 77.1 kg 75.7 kg 84.3 kg    Exam: General exam: In no acute distress. Respiratory system: Good air movement and clear to auscultation. Cardiovascular system: S1 & S2 heard, RRR. No JVD. Gastrointestinal system: Abdomen is nondistended, soft and nontender.  Extremities: No pedal edema. Skin: No rashes, lesions or ulcers Psychiatry: Judgement and insight appear normal. Mood & affect appropriate.   Data Reviewed:    Labs: Basic Metabolic Panel: Recent Labs  Lab 02/02/21 0532 02/03/21 0526 02/04/21 0244 02/05/21 0547 02/06/21 0459  NA 132* 132* 136 138 133*  K 4.0 3.9 3.4* 3.5 3.4*  CL 96* 97* 104 109 110  CO2 _0 21* 17*  GLUCOSE 134* 164* 118* 111* 111*  BUN 17 25* _1 CREATININE 0.71 0.70 0.62 0.79 0.70  CALCIUM 8.5* 8.2* 8.4* 8.5* 7.8*  MG 2.2 2.4 2.3 2.0 1.6*    GFR Estimated Creatinine Clearance: 67.8  mL/min (by C-G formula based on SCr of 0.7 mg/dL). Liver Function Tests: Recent Labs  Lab 02/04/21 0901  AST 29  ALT 24  ALKPHOS 53  BILITOT 0.6  PROT 5.1*  ALBUMIN 2.6*    No results for input(s): LIPASE, AMYLASE in the last 168 hours. Recent Labs  Lab 02/04/21 0901 02/05/21 0547 02/05/21 0848  AMMONIA 68* 37* 35    Coagulation profile No results for input(s): INR, PROTIME in the last 168 hours. COVID-19 Labs  No results for input(s): DDIMER, FERRITIN, LDH, CRP in the last 72 hours.  Lab Results  Component Value Date   [place] NEGATIVE 01/28/2021    CBC: Recent Labs  Lab 02/02/21 0532 02/02/21 1233 02/03/21 0526 02/03/21 1218 02/04/21 0244 02/04/21 1148 02/04/21 1747 02/05/21 0011 02/05/21 0547 02/05/21 1734 02/06/21 0459  WBC 9.3  --  8.8 10.6* 8.6  --   --   --  8.0  --   --   NEUTROABS  --   --   --  8.2*  --   --   --   --   --   --   --   HGB 8.7*   < > 7.8* 8.4* 7.7*   < > 7.7* 7.8* 7.6* 7.9* 6.8*  HCT 27.5*   < > 23.9* 26.1* 24.0*   < > 24.3* 25.2* 23.9* 24.3* 22.1*  MCV 84.1  --  82.7 82.9 83.3  --   --   --  84.5  --   --   PLT 148*  --  162 191 177  --   --   --  178  --   --    < > = values in this interval not displayed.    Cardiac Enzymes: No results for input(s): CKTOTAL, CKMB, CKMBINDEX, TROPONINI in the last 168 hours. BNP (last 3 results) No results for input(s): PROBNP in the last 8760 hours. CBG: No results for input(s): GLUCAP in the last 168 hours. D-Dimer: No results for input(s): DDIMER in the last 72 hours. Hgb A1c: No results for input(s): HGBA1C in the last 72 hours. Lipid Profile: No results for input(s): CHOL, HDL, LDLCALC, TRIG, CHOLHDL, LDLDIRECT in the last 72 hours. Thyroid function studies: No results for input(s): TSH, T4TOTAL, T3FREE, THYROIDAB in the last 72 hours.  Invalid input(s): FREET3 Anemia work up: No results for input(s): VITAMINB12, FOLATE, FERRITIN, TIBC, IRON, RETICCTPCT in the last 72  hours. Sepsis Labs: Recent Labs  Lab 02/03/21 0526 02/03/21 1218 02/04/21 0244 02/05/21 0547  WBC 8.8 10.6* 8.6 8.0    Microbiology Recent Results (from the past 240 hour(s))  Resp Panel by RT-PCR (Flu A&B, Covid) Nasopharyngeal Swab     Status: None   Collection Time: 01/28/21  3:15 AM   Specimen: Nasopharyngeal Swab; Nasopharyngeal(NP) swabs in vial transport medium  Result Value Ref Range Status   SARS Coronavirus 2 by RT PCR NEGATIVE NEGATIVE Final    Comment: (NOTE) SARS-CoV-2 target nucleic acids are NOT DETECTED.  The SARS-CoV-2 RNA is generally detectable in upper respiratory specimens during the acute phase of infection. The lowest concentration of SARS-CoV-2 viral copies this assay can detect is 138 copies/mL. A negative result does not preclude SARS-Cov-2 infection and should not be used as the sole basis for treatment or other patient management decisions. A negative result may occur with  improper specimen collection/handling, submission of specimen other than nasopharyngeal swab, presence of viral mutation(s) within the areas targeted by this assay, and inadequate number of viral copies(<138 copies/mL). A negative result must be combined with clinical observations, patient history, and epidemiological information. The expected result is Negative.  Fact Sheet for Patients:  EntrepreneurPulse.com.au  Fact Sheet for Healthcare Providers:  IncredibleEmployment.be  This test is no t yet approved or cleared by the [place] FDA and  has been authorized for detection and/or diagnosis of SARS-CoV-2 by FDA under an Emergency Use Authorization (EUA). This EUA will remain  in effect (meaning this test can be used) for the duration of the COVID-19 declaration under Section 564(b)(1) of the Act, 21 U.S.C.section 360bbb-3(b)(1), unless the authorization is terminated  or revoked sooner.       Influenza A by PCR NEGATIVE NEGATIVE  Final   Influenza B by PCR NEGATIVE NEGATIVE Final    Comment: (NOTE) The Xpert Xpress SARS-CoV-2/FLU/RSV plus assay is intended as an aid in the diagnosis of influenza from Nasopharyngeal swab specimens  and should not be used as a sole basis for treatment. Nasal washings and aspirates are unacceptable for Xpert Xpress SARS-CoV-2/FLU/RSV testing.  Fact Sheet for Patients: EntrepreneurPulse.com.au  Fact Sheet for Healthcare Providers: IncredibleEmployment.be  This test is not yet approved or cleared by the [place] FDA and has been authorized for detection and/or diagnosis of SARS-CoV-2 by FDA under an Emergency Use Authorization (EUA). This EUA will remain in effect (meaning this test can be used) for the duration of the COVID-19 declaration under Section 564(b)(1) of the Act, 21 U.S.C. section 360bbb-3(b)(1), unless the authorization is terminated or revoked.  Performed at [facility], [place] [address], [place], [place] [address]   MRSA Next Gen by PCR, Nasal     Status: None   Collection Time: 02/03/21  8:00 PM   Specimen: Nasal Mucosa; Nasal Swab  Result Value Ref Range Status   MRSA by PCR Next Gen NOT DETECTED NOT DETECTED Final    Comment: (NOTE) The GeneXpert MRSA Assay (FDA approved for NASAL specimens only), is one component of a comprehensive MRSA colonization surveillance program. It is not intended to diagnose MRSA infection nor to guide or monitor treatment for MRSA infections. Test performance is not FDA approved in patients less than 39 years old. Performed at [facility], [place] [address], [place], [place] [address]      Medications:    sodium chloride   Intravenous Once   sodium chloride   Intravenous Once   chlorhexidine  15 mL Mouth Rinse BID   Chlorhexidine Gluconate Cloth  6 each Topical Daily   fentaNYL  1 patch Transdermal Q72H   levothyroxine  25 mcg Intravenous Daily    mouth rinse  15 mL Mouth Rinse q12n4p   pantoprazole (PROTONIX) IV  40 mg Intravenous Q12H   Continuous Infusions:  sodium chloride 75 mL/hr at 02/05/21 2046      LOS: 9 days   Charlynne Cousins  [facility]  02/06/2021, 8:19 AM

## 2021-02-06 NOTE — Evaluation (Signed)
Physical Therapy Evaluation Patient Details Name: Gloria Ward MRN: [id number] DOB: 03-23-1950 Today's Date: 02/06/2021  History of Present Illness  Gloria Ward is a 71 y.o. female admitted with metastatic moderately differentiated adenocarcinoma. PMH: HTN, hypothyroidism   Clinical Impression  Pt admitted with above diagnosis. Pt from home alone, ind at baseline, working and driving. Pt with newly diagnosed cancer, reports plans are to have family support at home. Pt requires min A with bed mobility going through semi log roll position for abdominal comfort. Pt requiring min guard/supv with transfers and ambulation. Pt with dyspnea 2/4 on RA, SpO2 93-96%, requested 2L O2 upon return to room so provided nasal cannula and notified RN. Recommend HHPT with RW vs rollator for ambulation. Pt currently with functional limitations due to the deficits listed below (see PT Problem List). Pt will benefit from skilled PT to increase their independence and safety with mobility to allow discharge to the venue listed below.          Recommendations for follow up therapy are one component of a multi-disciplinary discharge planning process, led by the attending physician.  Recommendations may be updated based on patient status, additional functional criteria and insurance authorization.  Follow Up Recommendations Home health PT    Assistance Recommended at Discharge PRN  Patient can return home with the following  Assistance with cooking/housework;Help with stairs or ramp for entrance    Equipment Recommendations  (RW vs rollator)  Recommendations for Other Services       Functional Status Assessment Patient has had a recent decline in their functional status and demonstrates the ability to make significant improvements in function in a reasonable and predictable amount of time.     Precautions / Restrictions Precautions Precautions: Fall Restrictions Weight Bearing Restrictions: No       Mobility  Bed Mobility Overal bed mobility: Needs Assistance Bed Mobility: Supine to Sit, Sit to Supine  Supine to sit: Min assist, HOB elevated Sit to supine: Min assist  General bed mobility comments: min A to upright trunk into sitting in semi log roll technique, min A to lift BLE back into bed    Transfers Overall transfer level: Needs assistance Equipment used: Rolling walker (2 wheels) Transfers: Sit to/from Stand Sit to Stand: Supervision  General transfer comment: VC for hand placement to power to stand, good steadiness upon rising    Ambulation/Gait Ambulation/Gait assistance: Min guard Gait Distance (Feet): 150 Feet Assistive device: Rolling walker (2 wheels) Gait Pattern/deviations: Step-through pattern, Decreased stride length Gait velocity: slightly decreased  General Gait Details: step through pattern with RW, dyspnea 2/4 on RA, able to clear obstacles appropriately with occasional close distance, dyspnea 2/4 on RA with SpO2 93-96%  Stairs            Wheelchair Mobility    Modified Rankin (Stroke Patients Only)       Balance Overall balance assessment: Mild deficits observed, not formally tested       Pertinent Vitals/Pain Pain Assessment Pain Assessment: Faces Faces Pain Scale: Hurts little more Pain Location: abdomen Pain Descriptors / Indicators: Aching, Sore Pain Intervention(s): Limited activity within patient's tolerance, Monitored during session, Premedicated before session    Home Living Family/patient expects to be discharged to:: Private residence Living Arrangements: Alone Available Help at Discharge: Family;Available PRN/intermittently Type of Home: House Home Access: Stairs to enter Entrance Stairs-Rails: Right Entrance Stairs-Number of Steps: 3   Home Layout: One level Home Equipment: Shower seat - built in  Prior Function Prior Level of Function : Independent/Modified Independent;Driving  Mobility Comments: pt  reports ind without AD, drives, 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 ADLs Comments: pt reports ind with ADLs/IADLs     Hand Dominance        Extremity/Trunk Assessment   Upper Extremity Assessment Upper Extremity Assessment: Overall WFL for tasks assessed    Lower Extremity Assessment Lower Extremity Assessment: Generalized weakness (AROM WNL, strength 3+/5 throughout, denies numbness/tingling)    Cervical / Trunk Assessment Cervical / Trunk Assessment: Normal  Communication   Communication: No difficulties  Cognition Arousal/Alertness: Awake/alert Behavior During Therapy: WFL for tasks assessed/performed Overall Cognitive Status: Within Functional Limits for tasks assessed  General Comments: pt occasionally closes eyes, reports just received pain medication and feels "a little groggy"        General Comments General comments (skin integrity, edema, etc.): Pt on RA with SpO2 93-96% with ambulation, dyspnea 2/4 and pt requesting 2L O2 so provided nasal cannula- RN enterring room at [facility] notified    Exercises     Assessment/Plan    PT Assessment Patient needs continued PT services  PT Problem List Decreased strength;Decreased activity tolerance;Decreased balance;Decreased mobility;Decreased knowledge of use of DME;Pain       PT Treatment Interventions DME instruction;Gait training;Stair training;Functional mobility training;Therapeutic activities;Therapeutic exercise;Balance training;Patient/family education    PT Goals (Current goals can be found in the Care Plan section)  Acute Rehab PT Goals Patient Stated Goal: return home with family support PT Goal Formulation: With patient/family Time For Goal Achievement: 02/20/21 Potential to Achieve Goals: Good    Frequency Min 3X/week     Co-evaluation               AM-PAC PT "6 Clicks" Mobility  Outcome Measure Help needed turning from your back to your side while in a flat bed without  using bedrails?: A Little Help needed moving from lying on your back to sitting on the side of a flat bed without using bedrails?: A Little Help needed moving to and from a bed to a chair (including a wheelchair)?: A Little Help needed standing up from a chair using your arms (e.g., wheelchair or bedside chair)?: A Little Help needed to walk in hospital room?: A Little Help needed climbing 3-5 steps with a railing? : A Little 6 Click Score: 18    End of Session   Activity Tolerance: Patient tolerated treatment well Patient left: in bed;with call bell/phone within reach;with nursing/sitter in room Nurse Communication: Mobility status;Other (comment) (SpO2) PT Visit Diagnosis: Other abnormalities of gait and mobility (R26.89);Muscle weakness (generalized) (M62.81)    Time: 6387-5643 PT Time Calculation (min) (ACUTE ONLY): 22 min   Charges:   PT Evaluation $PT Eval Low Complexity: 1 Low           Tori Layna Roeper PT, DPT 02/06/21, 4:15 PM

## 2021-02-07 ENCOUNTER — Encounter (HOSPITAL_COMMUNITY): Payer: Self-pay | Admitting: Family Medicine

## 2021-02-07 ENCOUNTER — Inpatient Hospital Stay (HOSPITAL_COMMUNITY): Payer: 59 | Admitting: Registered Nurse

## 2021-02-07 ENCOUNTER — Encounter (HOSPITAL_COMMUNITY)
Admission: EM | Disposition: A | Discharge status: Hospice | Payer: Self-pay | Source: Home / Self Care | Attending: Internal Medicine

## 2021-02-07 HISTORY — PX: ESOPHAGOGASTRODUODENOSCOPY: SHX5428

## 2021-02-07 LAB — MAGNESIUM: Magnesium: 1.6 mg/dL — ABNORMAL LOW (ref 1.7–2.4)

## 2021-02-07 LAB — HEMOGLOBIN AND HEMATOCRIT, BLOOD
HCT: 30.3 % — ABNORMAL LOW (ref 36.0–46.0)
HCT: 33.3 % — ABNORMAL LOW (ref 36.0–46.0)
Hemoglobin: 9.8 g/dL — ABNORMAL LOW (ref 12.0–15.0)
Hemoglobin: 10.6 g/dL — ABNORMAL LOW (ref 12.0–15.0)

## 2021-02-07 LAB — BASIC METABOLIC PANEL
Anion gap: 7 (ref 5–15)
BUN: 12 mg/dL (ref 8–23)
CO2: 19 mmol/L — ABNORMAL LOW (ref 22–32)
Calcium: 8.1 mg/dL — ABNORMAL LOW (ref 8.9–10.3)
Chloride: 107 mmol/L (ref 98–111)
Creatinine, Ser: 0.71 mg/dL (ref 0.44–1.00)
GFR, Estimated: >60 mL/min (ref >60)
Glucose, Bld: 108 mg/dL — ABNORMAL HIGH (ref 70–99)
Potassium: 3.5 mmol/L (ref 3.5–5.1)
Sodium: 133 mmol/L — ABNORMAL LOW (ref 135–145)

## 2021-02-07 LAB — HEPARIN LEVEL (UNFRACTIONATED): Heparin Unfractionated: 0.22 IU/mL — ABNORMAL LOW (ref 0.30–0.70)

## 2021-02-07 SURGERY — EGD (ESOPHAGOGASTRODUODENOSCOPY)
Anesthesia: Monitor Anesthesia Care

## 2021-02-07 MED ORDER — PROPOFOL 10 MG/ML IV BOLUS
INTRAVENOUS | Status: AC
  Filled 2021-02-07: qty 20

## 2021-02-07 MED ORDER — PANTOPRAZOLE SODIUM 40 MG IV SOLR: 40.0000 mg | INTRAVENOUS | Status: DC

## 2021-02-07 MED ORDER — BISACODYL 10 MG RE SUPP
10.0000 mg | Freq: Once | RECTAL | Status: AC
  Administered 2021-02-07: 10 mg via RECTAL
  Filled 2021-02-07: qty 1

## 2021-02-07 MED ORDER — OXYCODONE-ACETAMINOPHEN 10-325 MG PO TABS: 1.0000 | ORAL_TABLET | Freq: Four times a day (QID) | ORAL | Status: DC | PRN

## 2021-02-07 MED ORDER — OXYCODONE-ACETAMINOPHEN 5-325 MG PO TABS
1.0000 | ORAL_TABLET | Freq: Four times a day (QID) | ORAL | Status: DC | PRN
  Administered 2021-02-08 – 2021-02-10 (×4): 1 via ORAL
  Filled 2021-02-07 (×4): qty 1

## 2021-02-07 MED ORDER — MAGNESIUM HYDROXIDE 400 MG/5ML PO SUSP
15.0000 mL | Freq: Every day | ORAL | Status: DC
  Administered 2021-02-07 – 2021-02-10 (×3): 15 mL via ORAL
  Filled 2021-02-07 (×3): qty 30

## 2021-02-07 MED ORDER — ADULT MULTIVITAMIN W/MINERALS CH
1.0000 | ORAL_TABLET | Freq: Every day | ORAL | Status: DC
  Administered 2021-02-08 – 2021-02-10 (×3): 1 via ORAL
  Filled 2021-02-07 (×3): qty 1

## 2021-02-07 MED ORDER — HEPARIN (PORCINE) 25000 UT/250ML-% IV SOLN
1500.0000 [IU]/h | INTRAVENOUS | Status: DC
  Administered 2021-02-07: 1200 [IU]/h via INTRAVENOUS
  Filled 2021-02-07 (×2): qty 250

## 2021-02-07 MED ORDER — BISACODYL 5 MG PO TBEC
10.0000 mg | DELAYED_RELEASE_TABLET | Freq: Two times a day (BID) | ORAL | Status: DC
  Administered 2021-02-08 – 2021-02-10 (×4): 10 mg via ORAL
  Filled 2021-02-07 (×4): qty 2

## 2021-02-07 MED ORDER — MAGNESIUM 300 MG PO CAPS: 300.0000 mg | ORAL_CAPSULE | Freq: Every day | ORAL | Status: DC

## 2021-02-07 MED ORDER — LIOTHYRONINE SODIUM 25 MCG PO TABS
12.5000 ug | ORAL_TABLET | Freq: Once | ORAL | Status: AC
  Administered 2021-02-07: 12.5 ug via ORAL
  Filled 2021-02-07: qty 1

## 2021-02-07 MED ORDER — LIOTHYRONINE SODIUM 25 MCG PO TABS
37.5000 ug | ORAL_TABLET | Freq: Every day | ORAL | Status: DC
  Administered 2021-02-08 – 2021-02-09 (×2): 37.5 ug via ORAL
  Filled 2021-02-07 (×2): qty 2

## 2021-02-07 MED ORDER — AMPHETAMINE-DEXTROAMPHETAMINE 5 MG PO TABS: 12.5000 mg | ORAL_TABLET | Freq: Two times a day (BID) | ORAL | Status: DC

## 2021-02-07 MED ORDER — LORAZEPAM 2 MG PO TABS: 2.0000 mg | ORAL_TABLET | Freq: Every evening | ORAL | Status: DC | PRN

## 2021-02-07 MED ORDER — METOPROLOL SUCCINATE ER 100 MG PO TB24
100.0000 mg | ORAL_TABLET | Freq: Every day | ORAL | Status: DC
  Administered 2021-02-07 – 2021-02-10 (×2): 100 mg via ORAL
  Filled 2021-02-07 (×4): qty 1

## 2021-02-07 MED ORDER — LORAZEPAM 1 MG PO TABS
2.0000 mg | ORAL_TABLET | Freq: Every evening | ORAL | Status: DC | PRN
  Filled 2021-02-07: qty 2

## 2021-02-07 MED ORDER — PROPOFOL 500 MG/50ML IV EMUL
INTRAVENOUS | Status: DC | PRN
  Administered 2021-02-07: 150 ug/kg/min via INTRAVENOUS

## 2021-02-07 MED ORDER — OXYCODONE HCL 5 MG PO TABS
5.0000 mg | ORAL_TABLET | Freq: Four times a day (QID) | ORAL | Status: DC | PRN
  Administered 2021-02-09 – 2021-02-10 (×3): 5 mg via ORAL
  Filled 2021-02-07 (×3): qty 1

## 2021-02-07 MED ORDER — LACTATED RINGERS IV SOLN
INTRAVENOUS | Status: AC | PRN
  Administered 2021-02-07: 1000 mL via INTRAVENOUS

## 2021-02-07 MED ORDER — PROPOFOL 10 MG/ML IV BOLUS
INTRAVENOUS | Status: DC | PRN
Start: 2021-02-07 — End: 2021-02-07
  Administered 2021-02-07: 20 mg via INTRAVENOUS

## 2021-02-07 NOTE — Interval H&P Note (Signed)
History and Physical Interval Note:  70/female with metastatic pancreatic cancer with 1 episode of hematemesis and several episodes of black stools, suspected UGI bleeding for an EGD. 02/07/2021 10:38 AM  Gloria Ward  has presented today for EGD with the diagnosis of anemia, melena.  The various methods of treatment have been discussed with the patient and family. After consideration of risks, benefits and other options for treatment, the patient has consented to  Procedure(s): ESOPHAGOGASTRODUODENOSCOPY (EGD) (N/A) as a surgical intervention.  The patient's history has been reviewed, patient examined, no change in status, stable for surgery.  I have reviewed the patient's chart and labs.  Questions were answered to the patient's satisfaction.     [name]

## 2021-02-07 NOTE — Anesthesia Postprocedure Evaluation (Signed)
Anesthesia Post Note  Patient: Gloria Ward  Procedure(s) Performed: ESOPHAGOGASTRODUODENOSCOPY (EGD)     Patient location during evaluation: PACU Anesthesia Type: MAC Level of consciousness: awake and alert Pain management: pain level controlled Vital Signs Assessment: post-procedure vital signs reviewed and stable Respiratory status: spontaneous breathing, nonlabored ventilation, respiratory function stable and patient connected to nasal cannula oxygen Cardiovascular status: stable and blood pressure returned to baseline Postop Assessment: no apparent nausea or vomiting Anesthetic complications: no   No notable events documented.  Last Vitals:  Vitals:   02/07/21 1140 02/07/21 1150  BP: (!) 153/87   Pulse: 90 95  Resp: 12 14  Temp: 36.4 C   SpO2: 100% 96%    Last Pain:  Vitals:   02/07/21 1033  TempSrc: Tympanic  PainSc: 0-No pain                 Bereket Gernert S

## 2021-02-07 NOTE — Op Note (Signed)
[facility] Patient Name: Gloria Ward Procedure Date: 02/07/2021 MRN: [id number] Attending MD: [name] , MD Date of Birth: [birth year] CSN: [id number] Age: 71 Admit Type: Inpatient Procedure:                Upper GI endoscopy Indications:              Hematemesis Providers:                [name], MD, Burtis Junes, RN, Benetta Spar,                            Technician Referring MD:             [facility] Medicines:                Monitored Anesthesia Care Complications:            No immediate complications. Estimated Blood Loss:     Estimated blood loss: none. Procedure:                Pre-Anesthesia Assessment:                           - Prior to the procedure, a History and Physical                            was performed, and patient medications and                            allergies were reviewed. The patient's tolerance of                            previous anesthesia was also reviewed. The risks                            and benefits of the procedure and the sedation                            options and risks were discussed with the patient.                            All questions were answered, and informed consent                            was obtained. Prior Anticoagulants: The patient has                            taken no previous anticoagulant or antiplatelet                            agents. ASA Grade Assessment: III - A patient with                            severe systemic disease. After reviewing the risks                            and  benefits, the patient was deemed in                            satisfactory condition to undergo the procedure.                           After obtaining informed consent, the endoscope was                            passed under direct vision. Throughout the                            procedure, the patient's blood pressure, pulse, and                            oxygen saturations were  monitored continuously. The                            [id number]-H190 ([id number] Olympus endoscope was introduced                            through the mouth, and advanced to the second part                            of duodenum. The upper GI endoscopy was                            accomplished without difficulty. The patient                            tolerated the procedure well. Scope In: Scope Out: Findings:      The upper third of the esophagus and middle third of the esophagus were       normal.      LA Grade A (one or more mucosal breaks less than 5 mm, not extending       between tops of 2 mucosal folds) esophagitis with no bleeding was found       34 to 35 cm from the incisors.      Localized mildly erythematous mucosa without bleeding was found in the       gastric antrum.      A few localized small erosions with stigmata of recent bleeding were       found in the cardia and in the gastric fundus.      The examined duodenum was normal. Impression:               - Normal upper third of esophagus and middle third                            of esophagus.                           - LA Grade A esophagitis with no bleeding.                           - Erythematous mucosa in  the antrum.                           - Erosive gastropathy with stigmata of recent                            bleeding.                           - Normal examined duodenum.                           - No specimens collected. Moderate Sedation:      Patient did not receive moderate sedation for this procedure, but       instead received monitored anesthesia care. Recommendation:           - Resume regular diet.                           - Continue present medications.                           - Ok to use anticoagulation as required, no                            evidence of active bleeding noted. Procedure Code(s):        --- Professional ---                           [phone], Esophagogastroduodenoscopy,  flexible,                            transoral; diagnostic, including collection of                            specimen(s) by brushing or washing, when performed                            (separate procedure) Diagnosis Code(s):        --- Professional ---                           K20.90, Esophagitis, unspecified without bleeding                           K31.89, Other diseases of stomach and duodenum                           K92.2, Gastrointestinal hemorrhage, unspecified                           K92.0, Hematemesis CPT copyright 2019 American Medical Association. All rights reserved. The codes documented in this report are preliminary and upon coder review may  be revised to meet current compliance requirements. [name], MD 02/07/2021 11:42:23 AM This report has been signed electronically. Number of Addenda: 0

## 2021-02-07 NOTE — Progress Notes (Signed)
Chaplain engaged in an initial visit and introduced herself.  Gloria Ward's daughter was by her side.  Chaplain offered support and let her know she was there if needed.  Gloria Ward didn't appear to have any needs at this time.     02/07/21 1500  Clinical Encounter Type  Visited With Patient and family together  Visit Type Initial

## 2021-02-07 NOTE — Anesthesia Preprocedure Evaluation (Signed)
Anesthesia Evaluation  Patient identified by MRN, date of birth, ID band Patient awake  General Assessment Comment:Gloria Ward is an 71 y.o. 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  With an MRI of the abdomen that showed lesions involving the tail of the pancreas, multiple lymphadenopathy right liver lesion with a lower thoracic spine T10 involvement   Reviewed: Allergy & Precautions, NPO status , Patient's Chart, lab work & pertinent test results  Airway Mallampati: II  TM Distance: >3 FB Neck ROM: Full    Dental no notable dental hx.    Pulmonary neg pulmonary ROS,    Pulmonary exam normal breath sounds clear to auscultation       Cardiovascular hypertension, Pt. on medications Normal cardiovascular exam Rhythm:Regular Rate:Normal     Neuro/Psych negative neurological ROS  negative psych ROS   GI/Hepatic negative GI ROS, Probable metastatic pancreatic cancer   Endo/Other  Hypothyroidism   Renal/GU negative Renal ROS  negative genitourinary   Musculoskeletal negative musculoskeletal ROS (+)   Abdominal   Peds negative pediatric ROS (+)  Hematology  (+) anemia ,   Anesthesia Other Findings   Reproductive/Obstetrics negative OB ROS                             Anesthesia Physical Anesthesia Plan  ASA: 3  Anesthesia Plan: MAC   Post-op Pain Management: Minimal or no pain anticipated   Induction: Intravenous  PONV Risk Score and Plan: 2 and Propofol infusion and Treatment may vary due to age or medical condition  Airway Management Planned: Simple Face Mask  Additional Equipment:   Intra-op Plan:   Post-operative Plan:    Informed Consent: I have reviewed the patients History and Physical, chart, labs and discussed the procedure including the risks, benefits and alternatives for the proposed anesthesia with the patient or authorized representative who has indicated his/her understanding and acceptance.     Dental advisory given  Plan Discussed with: CRNA and Surgeon  Anesthesia Plan Comments:         Anesthesia Quick Evaluation

## 2021-02-07 NOTE — Progress Notes (Signed)
[place] for heparin Indication:  portal vein thrombosis  Allergies  Allergen Reactions   Amlodipine Swelling   Tramadol Hcl Other (See Comments)   Trazodone Hcl Other (See Comments)   Penicillins Rash and Other (See Comments)    Rash and hot all over. Has tolerated Keflex    Patient Measurements: Height: 5\' 2"  (157.5 cm) Weight: 84.3 kg (185 lb 13.6 oz) IBW/kg (Calculated) : 50.1 Heparin Dosing Weight: 69.1 kg  Vital Signs: Temp: 97.6 F (36.4 C) (01/27 1140) Temp Source: Tympanic (01/27 1033) BP: 152/87 (01/27 1200) Pulse Rate: 99 (01/27 1200)  Labs: Recent Labs    02/05/21 0547 02/05/21 1734 02/06/21 0459 02/06/21 1744 02/07/21 0518  HGB 7.6*   < > 6.8* 10.8* 9.8*  HCT 23.9*   < > 22.1* 33.7* 30.3*  PLT 178  --   --   --   --   CREATININE 0.79  --  0.70  --  0.71   < > = values in this interval not displayed.    Estimated Creatinine Clearance: 67.8 mL/min (by C-G formula based on SCr of 0.71 mg/dL).   Medical History: Past Medical History:  Diagnosis Date   Anxiety    Heart murmur    no prob - no med   Hypertension    Hypothyroidism    Insomnia    Assessment: 71 yo F presents with abdominal pain and melena. Also found to have portal vein thrombosis. Was started on heparin and therapeutic with dose around 1,200-1,350 units/hr. Anticoag has been on hold due to bleed. Went for EGD today and GI ok with resuming anticoag if needed. No acute bleeding noted. Discussed with primary and will restart heparin IV today. Hgb 9.8, plts 178.   Goal of Therapy:  Heparin level 0.3-0.5 units/mL Monitor platelets by anticoagulation protocol: Yes   Plan:  No heparin bolus Start heparin drip at 1,200 units/hr Check 6 hr heparin level Monitor daily heparin level, CBC, s/s of bleed If CBC remains stable, plan to transition to [place] soon  Elenor Quinones, PharmD, BCPS, BCIDP Clinical Pharmacist 02/07/2021 1:15 PM

## 2021-02-07 NOTE — Progress Notes (Addendum)
[facility] PROGRESS NOTE    Progress Note  Gloria Ward  VHQ:[id number] DOB: 1950-03-30 DOA: 01/27/2021 PCP: Lawerance Cruel, MD     Brief Narrative:   Gloria Ward is an 71 y.o. 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  With an MRI of the abdomen that showed lesions involving the tail of the pancreas, multiple lymphadenopathy right liver lesion with a lower thoracic spine T10 involvement  Significant studies: 02/03/2021 MRI of the brain showed 2 punctuate of fossae of possible artifactual. 02/02/2021 CT scan of the abdomen and pelvis that showed ascites, bilateral pleural effusion lesion in the pancreatic tail measuring about 3 x 2 cm highly concerning for pancreatic adenocarcinoma multiple lymphadenopathies at the celiac axis and hepatoduodenal ligament.  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  1.7 cm liver lesion and a 1.4 cm enhancing T10 thoracic lesion  Antibiotics: None  Microbiology data: Blood culture:  Procedures: 01/30/2021 liver biopsy Metastatic moderately differentiated adenocarcinoma  Assessment/Plan:   Metastatic moderately differentiated adenocarcinoma: Liver biopsy on 01/30/2021 showed moderately differentiated metastatic adenocarcinoma. Oncology was consulted and recommended follow-up with them as an outpatient. Palliative care was consulted and met with family 02/05/2020 she is now a DNR.    Acute metabolic encephalopathy: Likely due to narcotic use..  As she was partially responsive to Narcan.  Abdominal pain: Likely due to pancreatic cancer. Currently on IV fentanyl and a  patch.  She relates her pain is controlled  Melanotic stools/GI bleed/acute blood loss anemia: Was taking NSAIDs at home. Currently on IV PPI. She is status post 2 units of packed red blood cells her hemoglobin this morning is 9.8. She has agreed to EGD, GI notified.  Hypothyroidism: Continue current home regimen.  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 Due to her ongoing GI bleed.  Essential hypertension: Stable continue labetalol and hydralazine as needed.  Acute urinary retention: We will give her a voiding trial.  DVT prophylaxis: SCD's Family Communication:son Status is: Inpatient  Remains inpatient appropriate because: Metastatic adenocarcinoma      Code Status:     Code Status Orders  (From admission, onward)           Start     Ordered   02/04/21 1334  Do not attempt resuscitation (DNR)  Continuous       Question Answer Comment  In the event of cardiac or respiratory ARREST Do not call a code blue   In the event of cardiac or respiratory ARREST Do not perform Intubation, CPR, defibrillation or ACLS   In the event of cardiac or respiratory ARREST Use medication by any route, position, wound care, and other measures to relive pain and suffering. May use oxygen, suction and manual treatment of airway obstruction as needed for comfort.      02/04/21 1333           Code Status History     Date Active Date Inactive Code Status Order ID Comments User Context   01/28/2021 0415 02/04/2021 1333 Full Code [id number]  Chotiner, Yevonne Aline, MD ED         IV  Access:   Peripheral IV   Procedures and diagnostic studies:   No results found.   Medical Consultants:   None.   Subjective:    Gloria Ward wants to have a bowel movement.  Objective:    Vitals:   02/06/21 1517 02/06/21 2229 02/06/21 2321 02/07/21 0428  BP: 121/74 (!) 145/87  (!) 141/80  Pulse: 98 (!) 110 (!) 101 (!)  104  Resp: 16   16  Temp: 98.4 F (36.9 C) 98 F (36.7 C)  97.9 F (36.6 C)  TempSrc: Oral Oral  Oral  SpO2: 98% 98% 98% 95%  Weight:      Height:       SpO2: 95 % O2 Flow Rate (L/min): 2 L/min FiO2 (%): 21 %   Intake/Output Summary (Last 24 hours) at 02/07/2021 0806 Last data filed at 02/07/2021 0433 Gross per 24 hour  Intake 2853.67 ml  Output 950 ml  Net 1903.67 ml    Filed Weights   01/27/21 1822 01/28/21 1956 02/03/21 1200  Weight: 77.1 kg 75.7 kg 84.3 kg    Exam: General exam: In no acute distress. Respiratory system: Good air movement and clear to auscultation. Cardiovascular system: S1 & S2 heard, RRR. No JVD. Gastrointestinal system: Abdomen is nondistended, soft and nontender.  Extremities: No pedal edema. Skin: No rashes, lesions or ulcers Psychiatry: Judgement and insight appear normal. Mood & affect appropriate.   Data Reviewed:    Labs: Basic Metabolic Panel: Recent Labs  Lab 02/03/21 0526 02/04/21 0244 02/05/21 0547 02/06/21 0459 02/07/21 0518  NA 132* 136 138 133* 133*  K 3.9 3.4* 3.5 3.4* 3.5  CL 97* 104 109 110 107  CO2 30 24 21* 17* 19*  GLUCOSE 164* 118* 111* 111* 108*  BUN 25* 20 17 12 12   CREATININE 0.70 0.62 0.79 0.70 0.71  CALCIUM 8.2* 8.4* 8.5* 7.8* 8.1*  MG 2.4 2.3 2.0 1.6* 1.6*    GFR Estimated Creatinine Clearance: 67.8 mL/min (by C-G formula based on SCr of 0.71 mg/dL). Liver Function Tests: Recent Labs  Lab 02/04/21 0901  AST 29  ALT 24  ALKPHOS 53  BILITOT 0.6  PROT 5.1*  ALBUMIN 2.6*    No results for input(s): LIPASE, AMYLASE in the last 168 hours. Recent Labs  Lab 02/04/21 0901 02/05/21 0547 02/05/21 0848  AMMONIA 68* 37* 35    Coagulation profile No results for input(s): INR, PROTIME in the last 168 hours. COVID-19 Labs  No results for input(s): DDIMER, FERRITIN, LDH, CRP in the last 72 hours.  Lab Results  Component Value Date   [place] NEGATIVE 01/28/2021    CBC: Recent Labs  Lab  02/02/21 0532 02/02/21 1233 02/03/21 0526 02/03/21 1218 02/04/21 0244 02/04/21 1148 02/05/21 0547 02/05/21 1734 02/06/21 0459 02/06/21 1744 02/07/21 0518  WBC 9.3  --  8.8 10.6* 8.6  --  8.0  --   --   --   --   NEUTROABS  --   --   --  8.2*  --   --   --   --   --   --   --   HGB 8.7*   < > 7.8* 8.4* 7.7*   < > 7.6* 7.9* 6.8* 10.8* 9.8*  HCT 27.5*   < > 23.9* 26.1* 24.0*   < > 23.9* 24.3* 22.1* 33.7* 30.3*  MCV 84.1  --  82.7 82.9 83.3  --  84.5  --   --   --   --  PLT 148*  --  162 191 177  --  178  --   --   --   --    < > = values in this interval not displayed.    Cardiac Enzymes: No results for input(s): CKTOTAL, CKMB, CKMBINDEX, TROPONINI in the last 168 hours. BNP (last 3 results) No results for input(s): PROBNP in the last 8760 hours. CBG: No results for input(s): GLUCAP in the last 168 hours. D-Dimer: No results for input(s): DDIMER in the last 72 hours. Hgb A1c: No results for input(s): HGBA1C in the last 72 hours. Lipid Profile: No results for input(s): CHOL, HDL, LDLCALC, TRIG, CHOLHDL, LDLDIRECT in the last 72 hours. Thyroid function studies: No results for input(s): TSH, T4TOTAL, T3FREE, THYROIDAB in the last 72 hours.  Invalid input(s): FREET3 Anemia work up: No results for input(s): VITAMINB12, FOLATE, FERRITIN, TIBC, IRON, RETICCTPCT in the last 72 hours. Sepsis Labs: Recent Labs  Lab 02/03/21 0526 02/03/21 1218 02/04/21 0244 02/05/21 0547  WBC 8.8 10.6* 8.6 8.0    Microbiology Recent Results (from the past 240 hour(s))  MRSA Next Gen by PCR, Nasal     Status: None   Collection Time: 02/03/21  8:00 PM   Specimen: Nasal Mucosa; Nasal Swab  Result Value Ref Range Status   MRSA by PCR Next Gen NOT DETECTED NOT DETECTED Final    Comment: (NOTE) The GeneXpert MRSA Assay (FDA approved for NASAL specimens only), is one component of a comprehensive MRSA colonization surveillance program. It is not intended to diagnose MRSA infection nor to  guide or monitor treatment for MRSA infections. Test performance is not FDA approved in patients less than 23 years old. Performed at [facility], [place] [address] [place], [place] [address]      Medications:    sodium chloride   Intravenous Once   chlorhexidine  15 mL Mouth Rinse BID   Chlorhexidine Gluconate Cloth  6 each Topical Daily   fentaNYL  1 patch Transdermal Q72H   fentaNYL  1 patch Transdermal Q72H   liothyronine  25 mcg Oral Daily   mouth rinse  15 mL Mouth Rinse q12n4p   pantoprazole (PROTONIX) IV  40 mg Intravenous Q12H   polyethylene glycol  17 g Oral BID   Continuous Infusions:  sodium chloride 75 mL/hr at 02/07/21 0307   ondansetron (ZOFRAN) IV        LOS: 10 days   Charlynne Cousins  [facility]  02/07/2021, 8:06 AM

## 2021-02-07 NOTE — Progress Notes (Signed)
Daily Progress Note   Patient Name: Gloria Ward       Date: 02/07/2021 DOB: 1950/04/16  Age: 71 y.o. MRN#: [id number] Attending Physician: Charlynne Cousins, MD Primary Care Physician: Lawerance Cruel, MD Admit Date: 01/27/2021 Length of Stay: 10 days  Reason for Consultation/Follow-up: Establishing goals of care and Pain control  HPI/Patient Profile:  70 y.o. female  with past medical history of anxiety, HTN, Hashimoto's thyroiditis who presented to the hospital with approximately 2-week complaint of intermittent abdominal pain also radiating to her back.  She was also having black tarry stools at home.  She endorsed taking Aleve at essentially max dose for approximately 4 weeks prior to admission and was also using Norco for pain control.   CT abdomen/pelvis on admission showed a splenic vein thrombus and pancreatic mass with recommendation for MRI to further correlate.   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   PMT was consulted for ongoing [place] discussions and pain control.  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  She was also noted to have change in mental status and transition to ICU where she was transitioned off of PCA.  Subjective:   Subjective: Met with patient and her daughter.  She is currently receiving blood.  She is awake and alert, but a little bit sleepier today.  We discussed overall clinical course and concern about drop in hemoglobin may be indicative of continued bleeding.  This is been without restarting anticoagulation.  Discussed concern that if she does have continued bleeding, this would portend a very short prognosis.  Discussed that if it appears bleeding has  continued (because of drop in hemoglobin) options would be to have the EGD completed or accept that her prognosis is very limited and consider transition to full comfort care.  Objective:   Vital Signs:  BP (!) 141/80 (BP Location: Left Arm)    Pulse (!) 104    Temp 97.9 F (36.6 C) (Oral)    Resp 16    Ht 5' 2"  (1.575 m)    Wt 84.3 kg    LMP 06/30/2011    SpO2 95%    BMI 33.99 kg/m   Physical Exam: Lying in bed.  Moaning in distress. Regular work of breathing No edema   Palliative Assessment/Data: 30   Assessment & Plan:   Impression: Present on Admission:  Metastatic moderately differentiated adenocarcinoma  71 year old female admitted with abdominal pain and imaging suggests likely newly diagnosed metastatic pancreatic cancer.  She is awaiting results of her  biopsy.  At times, she is still having significant pain primarily in the abdomen and buttock/sciatic area (possible nerve root compression from thoracic region?).  He is working pain management and PM&R with goals of care discussions which can be further s/p results.  SUMMARY OF RECOMMENDATIONS   DNR/DNI Continue current care but with focus on pain management as top priority. Discussed risk vs benefit of EGD.  She is now showing signs of bleeding (with a drop in hemoglobin) and is currently receiving blood.  I told her that this may change thought process on her desire to have EGD as she continues to bleed and does not desire EGD, her prognosis is likely very limited.  She still has reservations, but we discussed a  plan to reassess her hemoglobin tomorrow morning and if it appears she is still having signs of active bleeding based upon hemoglobin, plan will be for EGD tomorrow.  She would like to speak with the anesthesiologist prior to having EGD performed. Cancer related pain: Plan to increase fentanyl patch to 50 mcg/h total.  While this is an adjustment of her patch within the first 72 hours of initiation, she is still using  fentanyl round-the-clock at higher doses than this and she was right at the point where I was considering starting at either 25 or 50 mcg we initially talked about initiating fentanyl patch.  Continue IV fentanyl for breakthrough.  Code Status: DNR  Prognosis: Guarded  Discharge Planning: To Be Determined  Discussed with: Medical team, nursing team, patient  Thank you for allowing [name] to participate in the care of Gloria Ward PMT will continue to support holistically.  Micheline Rough, MD [facility] [phone]   Team Phone # [phone]

## 2021-02-07 NOTE — Progress Notes (Signed)
PT Cancellation Note  Patient Details Name: Gloria Ward MRN: [id number] DOB: 08/22/1950   Cancelled Treatment:    Reason Eval/Treat Not Completed: Patient at procedure or test/unavailable. Pt recently returned from EGD and resting; will check back as schedule permits.    Talbot Grumbling PT, DPT 02/07/21, 12:58 PM

## 2021-02-07 NOTE — Transfer of Care (Signed)
Immediate Anesthesia Transfer of Care Note  Patient: Gloria Ward  Procedure(s) Performed: ESOPHAGOGASTRODUODENOSCOPY (EGD)  Patient Location: PACU and Endoscopy Unit  Anesthesia Type:MAC  Level of Consciousness: awake, alert , oriented and patient cooperative  Airway & Oxygen Therapy: Patient Spontanous Breathing and Patient connected to face mask oxygen  Post-op Assessment: Report given to RN, Post -op Vital signs reviewed and stable and Patient moving all extremities  Post vital signs: Reviewed and stable  Last Vitals:  Vitals Value Taken Time  BP    Temp    Pulse 94 02/07/21 1141  Resp 18 02/07/21 1141  SpO2 100 % 02/07/21 1141  Vitals shown include unvalidated device data.  Last Pain:  Vitals:   02/07/21 1033  TempSrc: Tympanic  PainSc: 0-No pain      Patients Stated Pain Goal: 5 (71/29/29 0903)  Complications: No notable events documented.

## 2021-02-07 NOTE — Progress Notes (Signed)
[facility] Gastroenterology Progress Note  Gloria Ward 71 y.o. [birth year]  CC: Metastatic pancreatic carcinoma   Subjective: Patient states she is doing well this morning. Would like to proceed with EGD. Denies abdominal pain, nausea, vomiting. Son at bedside.  ROS : Review of Systems  Gastrointestinal:  Negative for abdominal pain, blood in stool, constipation, diarrhea, heartburn, melena, nausea and vomiting.  Genitourinary:  Negative for dysuria and urgency.     Objective: Vital signs in last 24 hours: Vitals:   02/07/21 0428 02/07/21 1033  BP: (!) 141/80 (!) 144/70  Pulse: (!) 104 98  Resp: 16 11  Temp: 97.9 F (36.6 C) 98 F (36.7 C)  SpO2: 95%     Physical Exam:  General:  Alert, cooperative, no distress, appears stated age  Head:  Normocephalic, without obvious abnormality, atraumatic  Eyes:  Anicteric sclera, EOM's intact  Lungs:   Clear to auscultation bilaterally, respirations unlabored  Heart:  Regular rate and rhythm, S1, S2 normal  Abdomen:   Soft, non-tender,mildly distended bowel sounds active all four quadrants,  no masses,     Lab Results: Recent Labs    02/06/21 0459 02/07/21 0518  NA 133* 133*  K 3.4* 3.5  CL 110 107  CO2 17* 19*  GLUCOSE 111* 108*  BUN 12 12  CREATININE 0.70 0.71  CALCIUM 7.8* 8.1*  MG 1.6* 1.6*   No results for input(s): AST, ALT, ALKPHOS, BILITOT, PROT, ALBUMIN in the last 72 hours. Recent Labs    02/05/21 0547 02/05/21 1734 02/06/21 1744 02/07/21 0518  WBC 8.0  --   --   --   HGB 7.6*   < > 10.8* 9.8*  HCT 23.9*   < > 33.7* 30.3*  MCV 84.5  --   --   --   PLT 178  --   --   --    < > = values in this interval not displayed.   No results for input(s): LABPROT, INR in the last 72 hours.    Assessment Metastatic pancreatic carcinoma - CT ab/pelvis with contrast: new small volume ascites, bilateral pleural effusions, and anasarca. No bowel wall thickening or distention. - biopsy results still pending. 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 I thoroughly discussed the procedures to include nature, alternatives, benefits, and risks including but not limited to bleeding, perforation, infection, anesthesia/cardiac and pulmonary complications. Patient provides understanding and gave verbal consent to proceed.   [facility] will follow.     Jarica Plass Radford Pax PA-C 02/07/2021, 11:12 AM  Contact #  [phone]

## 2021-02-07 NOTE — Progress Notes (Signed)
[place] for heparin Indication:  portal vein thrombosis  Allergies  Allergen Reactions   Amlodipine Swelling   Tramadol Hcl Other (See Comments)   Trazodone Hcl Other (See Comments)   Penicillins Rash and Other (See Comments)    Rash and hot all over. Has tolerated Keflex    Patient Measurements: Height: 5\' 2"  (157.5 cm) Weight: 84.3 kg (185 lb 13.6 oz) IBW/kg (Calculated) : 50.1 Heparin Dosing Weight: 69.1 kg  Vital Signs: Temp: 97.4 F (36.3 C) (01/27 1414) Temp Source: Oral (01/27 1414) BP: 129/81 (01/27 1414) Pulse Rate: 93 (01/27 1414)  Labs: Recent Labs    02/05/21 0547 02/05/21 1734 02/06/21 0459 02/06/21 1744 02/07/21 0518 02/07/21 1957  HGB 7.6*   < > 6.8* 10.8* 9.8* 10.6*  HCT 23.9*   < > 22.1* 33.7* 30.3* 33.3*  PLT 178  --   --   --   --   --   HEPARINUNFRC  --   --   --   --   --  0.22*  CREATININE 0.79  --  0.70  --  0.71  --    < > = values in this interval not displayed.     Estimated Creatinine Clearance: 67.8 mL/min (by C-G formula based on SCr of 0.71 mg/dL).   Medical History: Past Medical History:  Diagnosis Date   Anxiety    Heart murmur    no prob - no med   Hypertension    Hypothyroidism    Insomnia    Assessment: 71 yo F presents with abdominal pain and melena. Also found to have portal vein thrombosis. Was started on heparin and therapeutic with dose around 1,200-1,350 units/hr. Anticoag has been on hold due to bleed. Went for EGD today and GI ok with resuming anticoag if needed. No acute bleeding noted. Discussed with primary and will restart heparin IV today. Hgb 9.8, plts 178.   Today, 02/07/2021: First heparin level slightly low on 1200 units/hr Hgb low but stable, Plt WNL SCr stable WNL; at baseline No bleeding or infusion issues per RN - confirmed no pauses in heparin prior to lab draw  Goal of Therapy:  Heparin level 0.3-0.5 units/mL Monitor platelets by anticoagulation  protocol: Yes   Plan:  Increase heparin to 1500 units/hr Check 8 hr heparin level Monitor daily heparin level, CBC, s/s of bleed If CBC remains stable, plan to transition to [place] soon  Reuel Boom, PharmD, BCPS [phone] 02/07/2021, 10:01 PM

## 2021-02-08 ENCOUNTER — Inpatient Hospital Stay (HOSPITAL_COMMUNITY): Payer: 59

## 2021-02-08 DIAGNOSIS — R338 Other retention of urine: Secondary | ICD-10-CM

## 2021-02-08 DIAGNOSIS — K5900 Constipation, unspecified: Secondary | ICD-10-CM

## 2021-02-08 LAB — BASIC METABOLIC PANEL
Anion gap: 7 (ref 5–15)
BUN: 13 mg/dL (ref 8–23)
CO2: 21 mmol/L — ABNORMAL LOW (ref 22–32)
Calcium: 8.2 mg/dL — ABNORMAL LOW (ref 8.9–10.3)
Chloride: 102 mmol/L (ref 98–111)
Creatinine, Ser: 0.65 mg/dL (ref 0.44–1.00)
GFR, Estimated: >60 mL/min (ref >60)
Glucose, Bld: 125 mg/dL — ABNORMAL HIGH (ref 70–99)
Potassium: 3.8 mmol/L (ref 3.5–5.1)
Sodium: 130 mmol/L — ABNORMAL LOW (ref 135–145)

## 2021-02-08 LAB — MAGNESIUM: Magnesium: 2 mg/dL (ref 1.7–2.4)

## 2021-02-08 LAB — HEPARIN LEVEL (UNFRACTIONATED): Heparin Unfractionated: 0.31 IU/mL (ref 0.30–0.70)

## 2021-02-08 MED ORDER — LACTULOSE 10 GM/15ML PO SOLN
30.0000 g | Freq: Three times a day (TID) | ORAL | Status: AC
  Administered 2021-02-08 – 2021-02-09 (×2): 30 g via ORAL
  Filled 2021-02-08: qty 45

## 2021-02-08 MED ORDER — FENTANYL 25 MCG/HR TD PT72
1.0000 | MEDICATED_PATCH | TRANSDERMAL | Status: DC
  Administered 2021-02-08: 1 via TRANSDERMAL

## 2021-02-08 MED ORDER — APIXABAN 5 MG PO TABS: 5.0000 mg | ORAL_TABLET | Freq: Two times a day (BID) | ORAL | Status: DC

## 2021-02-08 MED ORDER — APIXABAN 5 MG PO TABS
10.0000 mg | ORAL_TABLET | Freq: Two times a day (BID) | ORAL | Status: DC
  Administered 2021-02-08 – 2021-02-10 (×5): 10 mg via ORAL
  Filled 2021-02-08 (×5): qty 2

## 2021-02-08 MED ORDER — SORBITOL 70 % SOLN
960.0000 mL | TOPICAL_OIL | Freq: Once | ORAL | Status: AC
  Administered 2021-02-08: 960 mL via RECTAL
  Filled 2021-02-08: qty 473

## 2021-02-08 MED ORDER — LACTULOSE 10 GM/15ML PO SOLN
30.0000 g | Freq: Three times a day (TID) | ORAL | Status: DC
  Filled 2021-02-08: qty 45

## 2021-02-08 MED ORDER — SORBITOL 70 % SOLN
60.0000 mL | Freq: Once | Status: AC
  Administered 2021-02-08: 60 mL via ORAL
  Filled 2021-02-08: qty 60

## 2021-02-08 MED ORDER — FENTANYL 75 MCG/HR TD PT72: 1.0000 | MEDICATED_PATCH | TRANSDERMAL | Status: DC

## 2021-02-08 MED ORDER — FENTANYL 25 MCG/HR TD PT72
1.0000 | MEDICATED_PATCH | TRANSDERMAL | Status: DC
Start: 2021-02-08 — End: 2021-02-08

## 2021-02-08 MED ORDER — FENTANYL 25 MCG/HR TD PT72: 1.0000 | MEDICATED_PATCH | TRANSDERMAL | Status: DC

## 2021-02-08 MED ORDER — PANTOPRAZOLE SODIUM 40 MG PO TBEC
40.0000 mg | DELAYED_RELEASE_TABLET | Freq: Every day | ORAL | Status: DC
  Administered 2021-02-08 – 2021-02-10 (×3): 40 mg via ORAL
  Filled 2021-02-08 (×3): qty 1

## 2021-02-08 NOTE — Progress Notes (Signed)
[facility] PROGRESS NOTE    Progress Note  Gloria Ward  JSE:[id number] DOB: June 29, 1950 DOA: 01/27/2021 PCP: Lawerance Cruel, MD     Brief Narrative:   Gloria Ward is an 71 y.o. 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 Oncology was consulted and recommended follow-up with them as an outpatient.  Significant studies: 02/03/2021 MRI of the brain showed 2 punctuate of fossae of possible artifactual. 02/02/2021 CT scan of the abdomen and pelvis that showed ascites, bilateral pleural effusion lesion in the pancreatic tail measuring about 3 x 2 cm highly concerning for pancreatic adenocarcinoma multiple lymphadenopathies at the celiac axis and hepatoduodenal ligament.  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  1.7 cm liver lesion and a 1.4 cm enhancing T10 thoracic lesion  Antibiotics: None  Microbiology data: Blood culture:  Procedures: 01/30/2021 liver biopsy Metastatic moderately differentiated adenocarcinoma  Assessment/Plan:   Metastatic moderately differentiated adenocarcinoma: Palliative care was consulted and met with family 02/05/2020 she is now a DNR. Palliative managing narcotics appreciate assistance. Awaiting physical therapy evaluation.    Acute metabolic encephalopathy: Likely due to narcotic use..  As she was partially  responsive to Narcan.  Abdominal pain: Likely due to pancreatic cancer. Pain is controlled. Has not had a bowel movement, check an abdominal x-ray give her a smog enema.  Melanotic stools/GI bleed/acute blood loss anemia: Was taking NSAIDs at home. Change Protonix to twice a day EGD was done that showed esophagitis no bleeding erythematous mucosa and erosive gastropathy with stigmata of bleeding  Hypothyroidism: Continue current home regimen.  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 Her on IV heparin and tolerated her hemoglobin has been stable. Transition to oral Eliquis. no signs of bleeding she  Essential hypertension: Stable continue labetalol and hydralazine as needed.  Acute urinary retention: Place a Foley if patient cannot  Constipation: Check an abdominal x-ray  DVT prophylaxis: SCD's Fam urinate.ily Communication:son Status is: Inpatient  Remains inpatient appropriate because: Metastatic adenocarcinoma      Code Status:     Code Status Orders  (From admission, onward)           Start     Ordered   02/04/21 1334  Do not attempt resuscitation (DNR)  Continuous       Question Answer Comment  In the event of cardiac or respiratory ARREST Do not call a code blue   In the event of cardiac or respiratory ARREST Do not perform Intubation, CPR, defibrillation or ACLS   In the event of cardiac or respiratory ARREST Use medication by any route, position, wound care, and other measures to relive pain and suffering. May use oxygen, suction and manual treatment of airway obstruction as needed for comfort.      02/04/21 1333           Code Status History     Date Active Date Inactive Code Status  Order ID Comments User Context   01/28/2021 0415 02/04/2021 1333 Full Code [id number]  Chotiner, Yevonne Aline, MD ED         IV Access:   Peripheral IV   Procedures and diagnostic studies:   No  results found.   Medical Consultants:   None.   Subjective:    Gloria Ward wants to have a bowel movement.  Objective:    Vitals:   02/07/21 1150 02/07/21 1200 02/07/21 1414 02/07/21 2235  BP: (!) 164/78 (!) 152/87 129/81 (!) 147/91  Pulse: 95 99 93 98  Resp: 14 17 16 20   Temp:   (!) 97.4 F (36.3 C) 97.7 F (36.5 C)  TempSrc:   Oral Oral  SpO2: 96%  98% 97%  Weight:      Height:       SpO2: 97 % O2 Flow Rate (L/min): 2 L/min FiO2 (%): 21 %   Intake/Output Summary (Last 24 hours) at 02/08/2021 0753 Last data filed at 02/08/2021 0510 Gross per 24 hour  Intake 1352.69 ml  Output 700 ml  Net 652.69 ml    Filed Weights   01/28/21 1956 02/03/21 1200 02/07/21 1033  Weight: 75.7 kg 84.3 kg 84.3 kg    Exam: General exam: In no acute distress. Respiratory system: Good air movement and clear to auscultation. Cardiovascular system: S1 & S2 heard, RRR. No JVD. Gastrointestinal system: Abdomen is nondistended, soft and nontender.  Extremities: No pedal edema. Skin: No rashes, lesions or ulcers Psychiatry: Judgement and insight appear normal. Mood & affect appropriate.   Data Reviewed:    Labs: Basic Metabolic Panel: Recent Labs  Lab 02/04/21 0244 02/05/21 0547 02/06/21 0459 02/07/21 0518 02/08/21 0547  NA 136 138 133* 133* 130*  K 3.4* 3.5 3.4* 3.5 3.8  CL 104 109 110 107 102  CO2 24 21* 17* 19* 21*  GLUCOSE 118* 111* 111* 108* 125*  BUN 20 17 12 12 13   CREATININE 0.62 0.79 0.70 0.71 0.65  CALCIUM 8.4* 8.5* 7.8* 8.1* 8.2*  MG 2.3 2.0 1.6* 1.6* 2.0    GFR Estimated Creatinine Clearance: 67.8 mL/min (by C-G formula based on SCr of 0.65 mg/dL). Liver Function Tests: Recent Labs  Lab 02/04/21 0901  AST 29  ALT 24  ALKPHOS 53  BILITOT 0.6  PROT 5.1*  ALBUMIN 2.6*    No results for input(s): LIPASE, AMYLASE in the last 168 hours. Recent Labs  Lab 02/04/21 0901 02/05/21 0547 02/05/21 0848  AMMONIA 68* 37* 35    Coagulation  profile No results for input(s): INR, PROTIME in the last 168 hours. COVID-19 Labs  No results for input(s): DDIMER, FERRITIN, LDH, CRP in the last 72 hours.  Lab Results  Component Value Date   [place] NEGATIVE 01/28/2021    CBC: Recent Labs  Lab 02/02/21 0532 02/02/21 1233 02/03/21 0526 02/03/21 1218 02/04/21 0244 02/04/21 1148 02/05/21 0547 02/05/21 1734 02/06/21 0459 02/06/21 1744 02/07/21 0518 02/07/21 1957  WBC 9.3  --  8.8 10.6* 8.6  --  8.0  --   --   --   --   --   NEUTROABS  --   --   --  8.2*  --   --   --   --   --   --   --   --   HGB 8.7*   < > 7.8* 8.4* 7.7*   < > 7.6* 7.9* 6.8* 10.8* 9.8* 10.6*  HCT 27.5*   < > 23.9* 26.1* 24.0*   < >  23.9* 24.3* 22.1* 33.7* 30.3* 33.3*  MCV 84.1  --  82.7 82.9 83.3  --  84.5  --   --   --   --   --   PLT 148*  --  162 191 177  --  178  --   --   --   --   --    < > = values in this interval not displayed.    Cardiac Enzymes: No results for input(s): CKTOTAL, CKMB, CKMBINDEX, TROPONINI in the last 168 hours. BNP (last 3 results) No results for input(s): PROBNP in the last 8760 hours. CBG: No results for input(s): GLUCAP in the last 168 hours. D-Dimer: No results for input(s): DDIMER in the last 72 hours. Hgb A1c: No results for input(s): HGBA1C in the last 72 hours. Lipid Profile: No results for input(s): CHOL, HDL, LDLCALC, TRIG, CHOLHDL, LDLDIRECT in the last 72 hours. Thyroid function studies: No results for input(s): TSH, T4TOTAL, T3FREE, THYROIDAB in the last 72 hours.  Invalid input(s): FREET3 Anemia work up: No results for input(s): VITAMINB12, FOLATE, FERRITIN, TIBC, IRON, RETICCTPCT in the last 72 hours. Sepsis Labs: Recent Labs  Lab 02/03/21 0526 02/03/21 1218 02/04/21 0244 02/05/21 0547  WBC 8.8 10.6* 8.6 8.0    Microbiology Recent Results (from the past 240 hour(s))  MRSA Next Gen by PCR, Nasal     Status: None   Collection Time: 02/03/21  8:00 PM   Specimen: Nasal Mucosa; Nasal Swab   Result Value Ref Range Status   MRSA by PCR Next Gen NOT DETECTED NOT DETECTED Final    Comment: (NOTE) The GeneXpert MRSA Assay (FDA approved for NASAL specimens only), is one component of a comprehensive MRSA colonization surveillance program. It is not intended to diagnose MRSA infection nor to guide or monitor treatment for MRSA infections. Test performance is not FDA approved in patients less than 52 years old. Performed at [facility], [place] [address] [place], [place] [address]      Medications:    sodium chloride   Intravenous Once   bisacodyl  10 mg Oral BID   chlorhexidine  15 mL Mouth Rinse BID   Chlorhexidine Gluconate Cloth  6 each Topical Daily   fentaNYL  1 patch Transdermal Q72H   fentaNYL  1 patch Transdermal Q72H   liothyronine  37.5 mcg Oral Daily   magnesium hydroxide  15 mL Oral Daily   mouth rinse  15 mL Mouth Rinse q12n4p   metoprolol succinate  100 mg Oral Daily   multivitamin with minerals  1 tablet Oral Daily   pantoprazole (PROTONIX) IV  40 mg Intravenous Q24H   polyethylene glycol  17 g Oral BID   Continuous Infusions:  sodium chloride 10 mL/hr at 02/08/21 0510   heparin 1,500 Units/hr (02/08/21 0510)   ondansetron (ZOFRAN) IV        LOS: 11 days   Charlynne Cousins  [facility]  02/08/2021, 7:53 AM

## 2021-02-08 NOTE — Progress Notes (Signed)
[place] for heparin >> apixaban  Indication:  portal vein thrombosis  Allergies  Allergen Reactions   Amlodipine Swelling   Tramadol Hcl Other (See Comments)   Trazodone Hcl Other (See Comments)   Penicillins Rash and Other (See Comments)    Rash and hot all over. Has tolerated Keflex    Patient Measurements: Height: 5\' 2"  (157.5 cm) Weight: 84.3 kg (185 lb 13.6 oz) IBW/kg (Calculated) : 50.1 Heparin Dosing Weight: 69.1 kg  Vital Signs: Temp: 97.7 F (36.5 C) (01/27 2235) Temp Source: Oral (01/27 2235) BP: 147/91 (01/27 2235) Pulse Rate: 98 (01/27 2235)  Labs: Recent Labs    02/06/21 0459 02/06/21 1744 02/07/21 0518 02/07/21 1957 02/08/21 0547  HGB 6.8* 10.8* 9.8* 10.6*  --   HCT 22.1* 33.7* 30.3* 33.3*  --   HEPARINUNFRC  --   --   --  0.22* 0.31  CREATININE 0.70  --  0.71  --  0.65     Estimated Creatinine Clearance: 67.8 mL/min (by C-G formula based on SCr of 0.65 mg/dL).   Medical History: Past Medical History:  Diagnosis Date   Anxiety    Heart murmur    no prob - no med   Hypertension    Hypothyroidism    Insomnia    Assessment: 71 yo F presents with abdominal pain and melena. Also found to have portal vein thrombosis. Was started on heparin and therapeutic with dose around 1,200-1,350 units/hr. Anticoag has been on hold due to bleed. Went for EGD today and GI ok with resuming anticoag if needed. No acute bleeding noted. Discussed with primary and will restart heparin IV today. Hgb 9.8, plts 178.   Today, 02/08/2021: AM HL in process but orders are being changed to apixaban  Hgb low but stable, Plt WNL ( 1/27)  SCr stable WNL; at baseline   Goal of Therapy:  Heparin level 0.3-0.5 units/mL Monitor platelets by anticoagulation protocol: Yes   Plan:  Stop  heparin drip and start apixaban 10 mg PO BID x 7 days then 5 mg PO BID  Monitor Scr CBC, s/s of bleed   Royetta Asal, PharmD, BCPS 02/08/2021 8:11  AM

## 2021-02-08 NOTE — Progress Notes (Signed)
Physical Therapy Treatment Patient Details Name: Gloria Ward MRN: [id number] DOB: 11/12/1950 Today's Date: 02/08/2021   History of Present Illness Gloria Ward is a 71 y.o. female admitted with metastatic moderately differentiated adenocarcinoma. PMH: HTN, hypothyroidism    PT Comments    Pt agreeable to working with PT. Pt participated fairly well. O2 94%, HR 134 bpm, audible wheezing with activity. Discussed d/c plan-pt and family appear unsure of what plan is. Encouraged them to continue discussions with palliative, TOC team, MD, and home hospice. Pt reports she has been up ambulating to/from bathroom.     Recommendations for follow up therapy are one component of a multi-disciplinary discharge planning process, led by the attending physician.  Recommendations may be updated based on patient status, additional functional criteria and insurance authorization.  Follow Up Recommendations  Home health PT vs Home Hospice-whichever pt/family decide upon     Assistance Recommended at Discharge PRN  Patient can return home with the following A little help with bathing/dressing/bathroom;Assist for transportation;Help with stairs or ramp for entrance;Assistance with cooking/housework   Equipment Recommendations  Rolling walker (2 wheels);BSC/3in1 Hospital bed    Recommendations for Other Services       Precautions / Restrictions Precautions Precautions: Fall Precaution Comments: incontinent Restrictions Weight Bearing Restrictions: No     Mobility  Bed Mobility Overal bed mobility: Needs Assistance Bed Mobility: Supine to Sit, Sit to Supine     Supine to sit: HOB elevated, Supervision Sit to supine: HOB elevated, Supervision   General bed mobility comments: Pt relied on bedrail. Increased time. Increased effort for LEs back onto bed.    Transfers Overall transfer level: Needs assistance Equipment used: Rolling walker (2 wheels) Transfers: Sit to/from Stand Sit to  Stand: Supervision           General transfer comment: Supv for safety.    Ambulation/Gait Ambulation/Gait assistance: Min guard Gait Distance (Feet): 150 Feet Assistive device: Rolling walker (2 wheels) Gait Pattern/deviations: Step-through pattern, Decreased stride length       General Gait Details: Pt walked x 1 without an assistive device to the bathroom. She then walked ~150 feet with a RW. O2 94% on RA, HR 134 bpm. Audible wheezing with activity.   Stairs             Wheelchair Mobility    Modified Rankin (Stroke Patients Only)       Balance Overall balance assessment: Needs assistance         Standing balance support: Bilateral upper extremity supported, During functional activity Standing balance-Leahy Scale: Fair                              Cognition Arousal/Alertness: Awake/alert Behavior During Therapy: WFL for tasks assessed/performed Overall Cognitive Status: Within Functional Limits for tasks assessed                                 General Comments: pt occasionally closes eyes        Exercises      General Comments        Pertinent Vitals/Pain Pain Assessment Pain Assessment: Faces Faces Pain Scale: Hurts whole lot Pain Descriptors / Indicators: Discomfort Pain Intervention(s): Monitored during session    Home Living  Prior Function            PT Goals (current goals can now be found in the care plan section) Progress towards PT goals: Progressing toward goals    Frequency    Min 3X/week      PT Plan      Co-evaluation              AM-PAC PT "6 Clicks" Mobility   Outcome Measure  Help needed turning from your back to your side while in a flat bed without using bedrails?: A Little Help needed moving from lying on your back to sitting on the side of a flat bed without using bedrails?: A Little Help needed moving to and from a bed to a chair  (including a wheelchair)?: A Little Help needed standing up from a chair using your arms (e.g., wheelchair or bedside chair)?: A Little Help needed to walk in hospital room?: A Little Help needed climbing 3-5 steps with a railing? : A Little 6 Click Score: 18    End of Session Equipment Utilized During Treatment: Gait belt Activity Tolerance: Patient tolerated treatment well;Patient limited by fatigue Patient left: in bed;with call bell/phone within reach;with family/visitor present   PT Visit Diagnosis: Muscle weakness (generalized) (M62.81);Difficulty in walking, not elsewhere classified (R26.2)     Time: 4235-3614 PT Time Calculation (min) (ACUTE ONLY): 21 min  Charges:  $Gait Training: 8-22 mins                         Doreatha Massed, PT Acute Rehabilitation  Office: [phone] Pager: [phone]

## 2021-02-08 NOTE — Progress Notes (Signed)
Daily Progress Note   Patient Name: Gloria Ward       Date: 02/08/2021 DOB: October 23, 1950  Age: 71 y.o. MRN#: [id number] Attending Physician: Charlynne Cousins, MD Primary Care Physician: Lawerance Cruel, MD Admit Date: 01/27/2021 Length of Stay: 11 days  Reason for Consultation/Follow-up: Establishing goals of care and Pain control  HPI/Patient Profile:  71 y.o. female  with past medical history of anxiety, HTN, Hashimoto's thyroiditis who presented to the hospital with approximately 2-week complaint of intermittent abdominal pain also radiating to her back.  She was also having black tarry stools at home.  She endorsed taking Aleve at essentially max dose for approximately 4 weeks prior to admission and was also using Norco for pain control.   CT abdomen/pelvis on admission showed a splenic vein thrombus and pancreatic mass with recommendation for MRI to further correlate.   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   PMT was consulted for ongoing [place] discussions and pain control.  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  She was also noted to have change in mental status and transition to ICU where she was transitioned off of PCA.  Subjective:   Subjective: I met today with Gloria Ward, her daughter, and her son-in-law.  We reviewed findings of EGD and restarting anticoagulation.  Discussed need to closely monitor hemoglobin to ensure no other signs of bleeding.  Discussed that if she does have other signs of bleeding, recommendation would likely be just to forego further anticoagulation.  She is not interested in further procedures.  We discussed pain management and opioid related constipation  which were her two major concerns today.  We also discussed overall goal being to get home and spend time with family.  Discussed hospice being be best support with this goal in mind.  We also discussed that she is going to need more assistance than hospice provides in regards to daily custodial care.  Her children can help, but they also work themselves.  Discussed that hospice is a great resource for families, however, they do not provide round-the-clock custodial care and family understands this.  Patient requested to specifically speak with case manager/social worker.  I have also recommended referral to hospice to have a liaison come and speak with them once they determine what organization they would like to work with for home hospice services.  Objective:   Vital Signs:  BP (!) 147/91 (BP Location: Left Arm)    Pulse 98    Temp 97.7 F (36.5 C) (Oral)    Resp 20    Ht 5' 2"  (1.575 m)    Wt 84.3 kg    LMP 06/30/2011    SpO2 97%    BMI 33.99 kg/m   Physical Exam: Lying in bed.  Moaning in distress. Regular work of breathing No edema   Palliative Assessment/Data: 30   Assessment & Plan:   Impression: Present on Admission:  Metastatic moderately differentiated adenocarcinoma  71 year old female admitted with abdominal pain and imaging suggests likely newly diagnosed metastatic pancreatic cancer.  She is awaiting results of her  biopsy.  At times, she is still having significant pain primarily in the abdomen and buttock/sciatic area (possible nerve root compression from thoracic region?).  He is working pain Mudlogger and  PM&R with goals of care discussions which can be further s/p results.  SUMMARY OF RECOMMENDATIONS   DNR/DNI Continue current care but with focus on pain management as top priority. EGD performed today without findings that would be significant risk for further bleeds.  Heparin restarted. Cancer related pain: Currently on fentanyl patch at 50 mcg/h total.  Continue  fentanyl 50-100 mcg as needed for breakthrough pain.  Agree with addition of oral rescue medication.  We will monitor usage and titrate as necessary to hopefully get her to a point where she is controlled off of IV pain medications over the weekend. Constipation, opioid related: She reports Dulcolax has been the most effective thing for her.  Increase this to 2 tabs twice daily.  She also requested something to help her go more immediately and order placed for Dulcolax suppository this evening.  We discussed bowel regimen at home and this often includes MiraLAX and Senokot on a regular basis for as long as she is going to need opioids (which will be the rest of her life).  Consider enema tomorrow if no results with suppository this evening. Overall, goal we discussed is getting her medically maximized over the next couple of days and working to transition home with the support of hospice.  Family reports they will need assistance in processing this and would like to speak with transition of care team.  Code Status: DNR  Prognosis: Guarded  Discharge Planning: To Be Determined  Discussed with: Medical team, nursing team, patient  Thank you for allowing [name] to participate in the care of Gloria Ward PMT will continue to support holistically.  Micheline Rough, MD [place] Team [phone]   Team Phone # [phone]

## 2021-02-09 MED ORDER — LORAZEPAM 0.5 MG PO TABS
0.5000 mg | ORAL_TABLET | Freq: Four times a day (QID) | ORAL | Status: DC | PRN
  Administered 2021-02-09 – 2021-02-10 (×2): 0.5 mg via ORAL
  Filled 2021-02-09 (×2): qty 1

## 2021-02-09 MED ORDER — LIOTHYRONINE SODIUM 25 MCG PO TABS
37.5000 ug | ORAL_TABLET | Freq: Every day | ORAL | Status: DC
  Administered 2021-02-10: 37.5 ug via ORAL
  Filled 2021-02-09: qty 2

## 2021-02-09 NOTE — Plan of Care (Signed)
°  Problem: Education: Goal: Knowledge of General Education information will improve Description: Including pain rating scale, medication(s)/side effects and non-pharmacologic comfort measures 02/09/2021 1726 by Eustace Pen, RN Outcome: Progressing 02/09/2021 1726 by Eustace Pen, RN Outcome: Progressing   Problem: Health Behavior/Discharge Planning: Goal: Ability to manage health-related needs will improve 02/09/2021 1726 by Eustace Pen, RN Outcome: Progressing 02/09/2021 1726 by Eustace Pen, RN Outcome: Progressing   Problem: Clinical Measurements: Goal: Ability to maintain clinical measurements within normal limits will improve 02/09/2021 1726 by Eustace Pen, RN Outcome: Progressing 02/09/2021 1726 by Eustace Pen, RN Outcome: Progressing Goal: Will remain free from infection 02/09/2021 1726 by Eustace Pen, RN Outcome: Progressing 02/09/2021 1726 by Eustace Pen, RN Outcome: Progressing Goal: Diagnostic test results will improve 02/09/2021 1726 by Eustace Pen, RN Outcome: Progressing 02/09/2021 1726 by Eustace Pen, RN Outcome: Progressing Goal: Respiratory complications will improve 02/09/2021 1726 by Eustace Pen, RN Outcome: Progressing 02/09/2021 1726 by Eustace Pen, RN Outcome: Progressing Goal: Cardiovascular complication will be avoided 02/09/2021 1726 by Eustace Pen, RN Outcome: Progressing 02/09/2021 1726 by Eustace Pen, RN Outcome: Progressing   Problem: Activity: Goal: Risk for activity intolerance will decrease 02/09/2021 1726 by Eustace Pen, RN Outcome: Progressing 02/09/2021 1726 by Eustace Pen, RN Outcome: Progressing   Problem: Nutrition: Goal: Adequate nutrition will be maintained 02/09/2021 1726 by Eustace Pen, RN Outcome: Progressing 02/09/2021 1726 by Eustace Pen, RN Outcome: Progressing   Problem: Coping: Goal: Level of anxiety will  decrease Outcome: Progressing   Problem: Elimination: Goal: Will not experience complications related to bowel motility Outcome: Progressing Goal: Will not experience complications related to urinary retention Outcome: Progressing   Problem: Pain Managment: Goal: General experience of comfort will improve Outcome: Progressing   Problem: Safety: Goal: Ability to remain free from injury will improve Outcome: Progressing   Problem: Skin Integrity: Goal: Risk for impaired skin integrity will decrease Outcome: Progressing

## 2021-02-09 NOTE — Progress Notes (Signed)
Daily Progress Note   Patient Name: Gloria Ward       Date: 02/09/2021 DOB: 07-24-1950  Age: 71 y.o. MRN#: [id number] Attending Physician: Charlynne Cousins, MD Primary Care Physician: Lawerance Cruel, MD Admit Date: 01/27/2021 Length of Stay: 12 days  Reason for Consultation/Follow-up: Establishing goals of care and Pain control  HPI/Patient Profile:  71 y.o. female  with past medical history of anxiety, HTN, Hashimoto's thyroiditis who presented to the hospital with approximately 2-week complaint of intermittent abdominal pain also radiating to her back.  She was also having black tarry stools at home.  She endorsed taking Aleve at essentially max dose for approximately 4 weeks prior to admission and was also using Norco for pain control.   CT abdomen/pelvis on admission showed a splenic vein thrombus and pancreatic mass with recommendation for MRI to further correlate.   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   PMT was consulted for ongoing [place] discussions and pain control.  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  She was also noted to have change in mental status and transition to ICU where she was transitioned off of PCA.  Subjective:   Subjective: I met today with Ms. Tassinari and her daughter.  Discussed anticoagulation for long-term in light of known splenic vein thrombus.  We again discussed pain management and opioid related constipation.  Constipation is primary concern today.  She has had multiple laxatives and interventions including enema without results.  We discussed Relistore is next likely option if she did not have a bowel movement overnight tonight.  [name] also asked my  thoughts on if hospice is still best plan moving forward.  I shared that I felt this would be the best support for Ms. Eastmond in light of her goals and continued need for aggressive symptom management when she leaves the hospital.  Currently, Ms. Schrodt lives near [employer].  [name] lives in [place] and if there will be a need for an inpatient hospice unit, they would prefer her to be in [facility].  Discussed that would ask TOC to make referral to [facility] [place] to discuss home hospice with them.  Objective:   Vital Signs:  BP 140/76 (BP Location: Right Arm)    Pulse (!) 107    Temp 98.3 F (36.8 C) (Oral)    Resp 18    Ht 5' 2"  (1.575 m)    Wt 84.3 kg    LMP 06/30/2011    SpO2 95%    BMI 33.99 kg/m   Physical Exam: Lying in bed.  In no acute distress. Regular work of breathing No edema   Palliative Assessment/Data: 30   Assessment & Plan:   Impression: Present on Admission:  Metastatic moderately differentiated adenocarcinoma  71 year old female admitted with abdominal pain and imaging suggests likely newly diagnosed metastatic pancreatic cancer.  She is awaiting results of her  biopsy.  At times, she is still having significant pain primarily in the abdomen and buttock/sciatic area (possible nerve root compression from thoracic region?).  He is working pain management and PM&R with goals of care discussions which can be further s/p results.  SUMMARY OF RECOMMENDATIONS   DNR/DNI Continue current care but with focus on pain management as  top priority. EGD performed today without findings that would be significant risk for further bleeds.  Heparin restarted and plan to transition to Eliquis. Cancer related pain: Currently on fentanyl patch at 50 mcg/h total.  She continues to use frequent breakthrough IV fentanyl.  Will increase fentanyl patch to 75 mcg/h.  In talking with her, however, she reports that a lot of her pain appears to be in her abdomen now.  This may  very well be related to her constipation.  Once we have increase fentanyl patch to 75 mcg/h, will work to transition to oral regimen tomorrow. Constipation, opioid related: This remains significant.  She has had multiple interventions including enema without result.  Abdominal x-ray is nonconcerning for obstruction.  She has had multiple laxatives today.  If still no bowel movement tomorrow, we discussed plan for trial of Relistor. Overall, goal we discussed is getting her medically maximized over the next couple of days and working to transition home with the support of hospice.  I talked again today about home hospice options.  Her daughter lives in [place] and being in [place] would be her preference if she were ever to need the inpatient hospice unit.  They would therefore like to transition home with hospice the [place] at time of discharge.  Referral placed to TOC.  Code Status: DNR  Prognosis: Guarded  Discharge Planning: To Be Determined  Discussed with: Medical team, nursing team, patient  Thank you for allowing [name] to participate in the care of MERRIDY PASCOE PMT will continue to support holistically.  Micheline Rough, MD [place] Team [phone]   Team Phone # [phone]

## 2021-02-09 NOTE — Progress Notes (Signed)
Daily Progress Note   Patient Name: Gloria Ward       Date: 02/09/2021 DOB: May 13, 1950  Age: 71 y.o. MRN#: [id number] Attending Physician: Charlynne Cousins, MD Primary Care Physician: Lawerance Cruel, MD Admit Date: 01/27/2021 Length of Stay: 12 days  Reason for Consultation/Follow-up: Establishing goals of care and Pain control  HPI/Patient Profile:  71 y.o. female  with past medical history of anxiety, HTN, Hashimoto's thyroiditis who presented to the hospital with approximately 2-week complaint of intermittent abdominal pain also radiating to her back.  She was also having black tarry stools at home.  She endorsed taking Aleve at essentially max dose for approximately 4 weeks prior to admission and was also using Norco for pain control.   CT abdomen/pelvis on admission showed a splenic vein thrombus and pancreatic mass with recommendation for MRI to further correlate.   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   PMT was consulted for ongoing [place] discussions and pain control.  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  She was also noted to have change in mental status and transition to ICU where she was transitioned off of PCA.  Subjective:   Subjective: I met today with Gloria Ward and her son, Gloria Ward.  We discussed plan to transition home with hospice support.  Major concern they have at this point is ensuring that equipment is delivered prior to discharge.  Reviewed plan for long-term anticoagulation due to thrombus.  Reviewed MAR and discussed pain management and constipation.  Her IV fentanyl use has dropped significantly and she did well with oral oxycodone this morning for rescue  pain.  Would continue same at time of discharge.  We discussed concerns that [name] and Gloria Ward had regarding plan to discharge home and things to look out for for her care for the future.  We reviewed her nutrition, cognition, and functional status being important roadsigns in how she is doing overall.  We also discussed concerns about falling at home, particularly as she is on blood thinner.  Discussed using walker and help as needed to safely move about.  Objective:   Vital Signs:  BP 140/76 (BP Location: Right Arm)    Pulse (!) 107    Temp 98.3 F (36.8 C) (Oral)    Resp 18    Ht 5' 2"  (1.575 m)    Wt 84.3 kg    LMP 06/30/2011    SpO2 95%    BMI 33.99 kg/m   Physical Exam: Lying in bed.  In no acute distress. Regular work of breathing No edema   Palliative Assessment/Data: 30   Assessment & Plan:   Impression: Present on Admission:  Metastatic moderately differentiated adenocarcinoma  71 year old female admitted with abdominal pain and imaging suggests likely newly diagnosed metastatic pancreatic cancer.  She is awaiting results of her  biopsy.  At times, she is still having significant pain primarily in the abdomen and buttock/sciatic area (possible nerve root compression from thoracic region?).  He is working pain management and PM&R with goals of care discussions which can be further s/p results.  SUMMARY OF RECOMMENDATIONS   DNR/DNI-this had been changed to full code for procedure and not changed back.  I discussed with her again today and may change back to  DNR/DNI in the chart. Continue current care but with focus on pain management as top priority. EGD performed without findings that would be significant risk for further bleeds.  Plan for Eliquis at discharge due to splenic vein thrombus. Cancer related pain: Currently on fentanyl patch at 75 mcg/h total.  Reviewed MAR and her overall IV fentanyl need has dropped significantly since having bowel movements.  She did  utilize oxycodone this morning with good result.  Would continue with this for oral pain rescue medication. Constipation, opioid related: Multiple bowel movements last night and her pain has improved significantly since then.  Continue bowel regimen. Overall goal we discussed is getting her medically maximized over the next couple of days and working to transition home with the support of hospice.  Referral has been placed for [facility] the [place].  Code Status: DNR  Prognosis: Guarded  Discharge Planning: To Be Determined  Discussed with: Medical team, nursing team, patient  Thank you for allowing [name] to participate in the care of Gloria Ward PMT will continue to support holistically.  Micheline Rough, MD [place] Team [phone]   Team Phone # [phone]

## 2021-02-09 NOTE — TOC Progression Note (Addendum)
Transition of Care [facility]) - Progression Note    Patient Details  Name: GWENNETH WHITEMAN MRN: [id number] Date of Birth: 03-02-1950  Transition of [facility] CM/SW Contact  Ross Ludwig, [place] Phone Number: 02/09/2021, 1:35 PM  Clinical Narrative:    CSW provided choice of hospice agencies to patient's daughter.  Daughter stated even though patient lives in [place], they would like [facility] [place], because in case patient needs inpatient hospice services, the daughter lives in [place].    CSW spoke to [place], and they are reviewing patient's case to make sure she qualifies.  CSW spoke to patient's daughter Joellen Jersey, they are requesting a 3 in 1, walker, and over the bed table.  CSW relayed this information to [place].  CSW asked [facility] if they can provide the equipment to the patient before she leaves the hospital, and they said they will see what they can do.  CSW asked daughter if patient will need EMS transport and she said no, they will arrange to get patient home.  CSW waiting to hear back from [place] to make sure patient qualifies.  CSW to continue to follow patient's progress throughout discharge planning.  4:15pm  CSW received phone call from [facility], they can accept patient.  Patient's family is requesting, walker, 3 in 1, and over the bed tray.  CSW was informed that equipment may be delivered to the room today, or tomorrow.  CSW updated attending physician.        Expected Discharge Plan and [place] with hospice services through [facility].                                             Social Determinants of Health (SDOH) Interventions    Readmission Risk Interventions No flowsheet data found.

## 2021-02-09 NOTE — Progress Notes (Signed)
[facility] PROGRESS NOTE    Progress Note  MARYN FREELOVE  ZHG:[id number] DOB: 05/06/1950 DOA: 01/27/2021 PCP: Lawerance Cruel, MD     Brief Narrative:   Gloria Ward is an 71 y.o. 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 Oncology was consulted and recommended follow-up with them as an outpatient.  Significant studies: 02/03/2021 MRI of the brain showed 2 punctuate of fossae of possible artifactual. 02/02/2021 CT scan of the abdomen and pelvis that showed ascites, bilateral pleural effusion lesion in the pancreatic tail measuring about 3 x 2 cm highly concerning for pancreatic adenocarcinoma multiple lymphadenopathies at the celiac axis and hepatoduodenal ligament.  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  1.7 cm liver lesion and a 1.4 cm enhancing T10 thoracic lesion  Antibiotics: None  Microbiology data: Blood culture:  Procedures: 01/30/2021 liver biopsy Metastatic moderately differentiated adenocarcinoma  Assessment/Plan:   Metastatic moderately differentiated adenocarcinoma: Palliative care was consulted and met with family 02/05/2020 she is now a DNR. Palliative managing narcotics appreciate assistance. Physical therapy evaluated the patient recommended home health PT. Case manager was consulted to assist with equipment.    Acute  metabolic encephalopathy: Likely due to narcotic use..  As she was partially responsive to Narcan.  Abdominal pain: Likely due to pancreatic cancer. Pain is controlled. Has not had a bowel movement, check an abdominal x-ray give her a smog enema.  Melanotic stools/GI bleed/acute blood loss anemia: Was taking NSAIDs at home. Change Protonix to twice a day EGD was done that showed esophagitis no bleeding erythematous mucosa and erosive gastropathy with stigmata of bleeding  Hypothyroidism: Continue current home regimen.  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 Her on IV heparin and tolerated her hemoglobin has been stable. Transition to oral Eliquis. no signs of bleeding she  Essential hypertension: Stable continue labetalol and hydralazine as needed.  Acute urinary retention: Place a Foley if patient cannot  Constipation: Check an abdominal x-ray  DVT prophylaxis: SCD's Fam urinate.ily Communication:son Status is: Inpatient  Remains inpatient appropriate because: Metastatic adenocarcinoma      Code Status:     Code Status Orders  (From admission, onward)           Start     Ordered   02/04/21 1334  Do not attempt resuscitation (DNR)  Continuous       Question Answer Comment  In the event of cardiac or respiratory ARREST Do not call a code blue   In the event of cardiac or respiratory ARREST Do not perform Intubation, CPR, defibrillation or ACLS   In the event of cardiac or respiratory ARREST Use medication by any route, position, wound care, and other measures to relive pain and suffering. May use oxygen, suction and manual treatment of airway obstruction as needed for comfort.      02/04/21 1333  Code Status History     Date Active Date Inactive Code Status Order ID Comments User Context   01/28/2021 0415 02/04/2021 1333 Full Code [id number]  Chotiner, Yevonne Aline, MD ED         IV  Access:   Peripheral IV   Procedures and diagnostic studies:   DG Abd 1 View  Result Date: 02/08/2021 CLINICAL DATA:  Per order: constipation. Pt reported continued abdominal pain due to tumors on her pancreas, liver, and spine. Pt stated her last bowel movement was a few days ago. EXAM: ABDOMEN - 1 VIEW COMPARISON:  02/02/2021 and older exams.  CT also dated 02/02/2021. FINDINGS: No bowel dilation to suggest obstruction. Colonic stool burden appears moderately increased. No convincing renal or ureteral stones. Soft tissues poorly defined but otherwise unremarkable. IMPRESSION: 1. No evidence of bowel obstruction.  No acute findings. 2. Moderate increase in the colonic stool burden. Electronically Signed   By: Lajean Manes M.D.   On: 02/08/2021 11:22     Medical Consultants:   None.   Subjective:    Dava Najjar relates she had multiple bowel movements will like to restart her lactulose  Objective:    Vitals:   02/07/21 2235 02/08/21 1824 02/08/21 2152 02/09/21 0528  BP: (!) 147/91 136/79 (!) 145/79 140/76  Pulse: 98 (!) 108 (!) 110 (!) 107  Resp: 20 17 18 18   Temp: 97.7 F (36.5 C) 98.2 F (36.8 C) 98.1 F (36.7 C) 98.3 F (36.8 C)  TempSrc: Oral Oral Oral Oral  SpO2: 97% 96% 97% 95%  Weight:      Height:       SpO2: 95 % O2 Flow Rate (L/min): 2 L/min FiO2 (%): 21 %   Intake/Output Summary (Last 24 hours) at 02/09/2021 0850 Last data filed at 02/09/2021 0500 Gross per 24 hour  Intake 240 ml  Output 1225 ml  Net -985 ml    Filed Weights   01/28/21 1956 02/03/21 1200 02/07/21 1033  Weight: 75.7 kg 84.3 kg 84.3 kg    Exam: General exam: In no acute distress. Respiratory system: Good air movement and clear to auscultation. Cardiovascular system: S1 & S2 heard, RRR. No JVD. Gastrointestinal system: Abdomen is nondistended, soft and nontender.  Extremities: No pedal edema. Skin: No rashes, lesions or ulcers Psychiatry: Judgement and insight appear normal.  Mood & affect appropriate.   Data Reviewed:    Labs: Basic Metabolic Panel: Recent Labs  Lab 02/04/21 0244 02/05/21 0547 02/06/21 0459 02/07/21 0518 02/08/21 0547  NA 136 138 133* 133* 130*  K 3.4* 3.5 3.4* 3.5 3.8  CL 104 109 110 107 102  CO2 24 21* 17* 19* 21*  GLUCOSE 118* 111* 111* 108* 125*  BUN 20 17 12 12 13   CREATININE 0.62 0.79 0.70 0.71 0.65  CALCIUM 8.4* 8.5* 7.8* 8.1* 8.2*  MG 2.3 2.0 1.6* 1.6* 2.0    GFR Estimated Creatinine Clearance: 67.8 mL/min (by C-G formula based on SCr of 0.65 mg/dL). Liver Function Tests: Recent Labs  Lab 02/04/21 0901  AST 29  ALT 24  ALKPHOS 53  BILITOT 0.6  PROT 5.1*  ALBUMIN 2.6*    No results for input(s): LIPASE, AMYLASE in the last 168 hours. Recent Labs  Lab 02/04/21 0901 02/05/21 0547 02/05/21 0848  AMMONIA 68* 37* 35    Coagulation profile No results for input(s): INR, PROTIME in the last 168 hours. COVID-19 Labs  No results for input(s): DDIMER, FERRITIN, LDH, CRP in the last 72  hours.  Lab Results  Component Value Date   [place] NEGATIVE 01/28/2021    CBC: Recent Labs  Lab 02/03/21 0526 02/03/21 1218 02/04/21 0244 02/04/21 1148 02/05/21 0547 02/05/21 1734 02/06/21 0459 02/06/21 1744 02/07/21 0518 02/07/21 1957  WBC 8.8 10.6* 8.6  --  8.0  --   --   --   --   --   NEUTROABS  --  8.2*  --   --   --   --   --   --   --   --   HGB 7.8* 8.4* 7.7*   < > 7.6* 7.9* 6.8* 10.8* 9.8* 10.6*  HCT 23.9* 26.1* 24.0*   < > 23.9* 24.3* 22.1* 33.7* 30.3* 33.3*  MCV 82.7 82.9 83.3  --  84.5  --   --   --   --   --   PLT 162 191 177  --  178  --   --   --   --   --    < > = values in this interval not displayed.    Cardiac Enzymes: No results for input(s): CKTOTAL, CKMB, CKMBINDEX, TROPONINI in the last 168 hours. BNP (last 3 results) No results for input(s): PROBNP in the last 8760 hours. CBG: No results for input(s): GLUCAP in the last 168 hours. D-Dimer: No results for input(s): DDIMER in the  last 72 hours. Hgb A1c: No results for input(s): HGBA1C in the last 72 hours. Lipid Profile: No results for input(s): CHOL, HDL, LDLCALC, TRIG, CHOLHDL, LDLDIRECT in the last 72 hours. Thyroid function studies: No results for input(s): TSH, T4TOTAL, T3FREE, THYROIDAB in the last 72 hours.  Invalid input(s): FREET3 Anemia work up: No results for input(s): VITAMINB12, FOLATE, FERRITIN, TIBC, IRON, RETICCTPCT in the last 72 hours. Sepsis Labs: Recent Labs  Lab 02/03/21 0526 02/03/21 1218 02/04/21 0244 02/05/21 0547  WBC 8.8 10.6* 8.6 8.0    Microbiology Recent Results (from the past 240 hour(s))  MRSA Next Gen by PCR, Nasal     Status: None   Collection Time: 02/03/21  8:00 PM   Specimen: Nasal Mucosa; Nasal Swab  Result Value Ref Range Status   MRSA by PCR Next Gen NOT DETECTED NOT DETECTED Final    Comment: (NOTE) The GeneXpert MRSA Assay (FDA approved for NASAL specimens only), is one component of a comprehensive MRSA colonization surveillance program. It is not intended to diagnose MRSA infection nor to guide or monitor treatment for MRSA infections. Test performance is not FDA approved in patients less than 24 years old. Performed at [facility], [place] [address], [place], [place] [address]      Medications:    sodium chloride   Intravenous Once   apixaban  10 mg Oral BID   Followed by   Derrill Memo ON 02/15/2021] apixaban  5 mg Oral BID   bisacodyl  10 mg Oral BID   chlorhexidine  15 mL Mouth Rinse BID   Chlorhexidine Gluconate Cloth  6 each Topical Daily   fentaNYL  1 patch Transdermal Q72H   fentaNYL  1 patch Transdermal Q72H   fentaNYL  1 patch Transdermal Q72H   [START ON 02/11/2021] fentaNYL  1 patch Transdermal Q72H   lactulose  30 g Oral TID   [START ON 02/10/2021] liothyronine  37.5 mcg Oral Daily   magnesium hydroxide  15 mL Oral Daily   mouth rinse  15 mL Mouth Rinse q12n4p   metoprolol succinate  100 mg Oral Daily  multivitamin with  minerals  1 tablet Oral Daily   pantoprazole  40 mg Oral Daily   Continuous Infusions:  sodium chloride Stopped (02/08/21 1800)   ondansetron (ZOFRAN) IV        LOS: 12 days   Charlynne Cousins  [facility]  02/09/2021, 8:50 AM

## 2021-02-09 NOTE — Discharge Instructions (Addendum)
Information on my medicine - ELIQUIS (apixaban)     Why was Eliquis prescribed for you? Eliquis was prescribed to treat blood clots that may have been found in the veins of your legs (deep vein thrombosis) or in your lungs (pulmonary embolism) and to reduce the risk of them occurring again.  What do You need to know about Eliquis ? The starting dose is 10 mg (two 5 mg tablets) taken TWICE daily for the FIRST SEVEN (7) DAYS, then on (enter date)  02/16/2020  the dose is reduced to ONE 5 mg tablet taken TWICE daily.  Eliquis may be taken with or without food.   Try to take the dose about the same time in the morning and in the evening. If you have difficulty swallowing the tablet whole please discuss with your pharmacist how to take the medication safely.  Take Eliquis exactly as prescribed and DO NOT stop taking Eliquis without talking to the doctor who prescribed the medication.  Stopping may increase your risk of developing a new blood clot.  Refill your prescription before you run out.  After discharge, you should have regular check-up appointments with your healthcare provider that is prescribing your Eliquis.    What do you do if you miss a dose? If a dose of ELIQUIS is not taken at the scheduled time, take it as soon as possible on the same day and twice-daily administration should be resumed. The dose should not be doubled to make up for a missed dose.  Important Safety Information A possible side effect of Eliquis is bleeding. You should call your healthcare provider right away if you experience any of the following: Bleeding from an injury or your nose that does not stop. Unusual colored urine (red or dark [place]) or unusual colored stools (red or black). Unusual bruising for unknown reasons. A serious fall or if you hit your head (even if there is no bleeding).  Some medicines may interact with Eliquis and might increase your risk of bleeding or clotting while on Eliquis.  To help avoid this, consult your healthcare provider or pharmacist prior to using any new prescription or non-prescription medications, including herbals, vitamins, non-steroidal anti-inflammatory drugs (NSAIDs) and supplements.  This website has more information on Eliquis (apixaban): http://[email or web address].com/eliquis/home

## 2021-02-10 ENCOUNTER — Encounter (HOSPITAL_COMMUNITY): Payer: Self-pay | Admitting: Gastroenterology

## 2021-02-10 DIAGNOSIS — R935 Abnormal findings on diagnostic imaging of other abdominal regions, including retroperitoneum: Secondary | ICD-10-CM

## 2021-02-10 LAB — TYPE AND SCREEN
ABO/RH(D): O POS
Antibody Screen: NEGATIVE
Unit division: 0
Unit division: 0
Unit division: 0

## 2021-02-10 LAB — BPAM RBC
Blood Product Expiration Date: 202302222359
Blood Product Expiration Date: 202302222359
Blood Product Expiration Date: 202302222359
ISSUE DATE / TIME: 202301260815
ISSUE DATE / TIME: 202301261218
Unit Type and Rh: 5100
Unit Type and Rh: 5100
Unit Type and Rh: 5100

## 2021-02-10 MED ORDER — APIXABAN 5 MG PO TABS: 10.0000 mg | ORAL_TABLET | Freq: Two times a day (BID) | ORAL | 3 refills | Status: DC

## 2021-02-10 MED ORDER — ZOLPIDEM TARTRATE 5 MG PO TABS: 5.0000 mg | ORAL_TABLET | Freq: Every evening | ORAL | 0 refills | Status: DC | PRN

## 2021-02-10 MED ORDER — PROCHLORPERAZINE MALEATE 5 MG PO TABS: 5.0000 mg | ORAL_TABLET | Freq: Four times a day (QID) | ORAL | 0 refills | Status: DC | PRN

## 2021-02-10 MED ORDER — FENTANYL 75 MCG/HR TD PT72: 1.0000 | MEDICATED_PATCH | TRANSDERMAL | 0 refills | Status: DC

## 2021-02-10 MED ORDER — BISACODYL 5 MG PO TBEC: 10.0000 mg | DELAYED_RELEASE_TABLET | Freq: Two times a day (BID) | ORAL | 0 refills | Status: DC

## 2021-02-10 MED ORDER — OXYCODONE-ACETAMINOPHEN 10-325 MG PO TABS: 1.0000 | ORAL_TABLET | Freq: Four times a day (QID) | ORAL | 0 refills | Status: AC | PRN

## 2021-02-10 NOTE — Discharge Summary (Addendum)
Physician Discharge Summary  Gloria Ward FKC:[id number] DOB: [birth year] DOA: 01/27/2021  PCP: Lawerance Cruel, MD  Admit date: 01/27/2021 Discharge date: 02/10/2021  Admitted From: Home Disposition:  Home  Recommendations for Outpatient Follow-up:  Follow up with PCP in 1-2 weeks Please obtain BMP/CBC in one week She will go home with hospice care. Hospice to give her a voiding trial at home to remove Foley.   Home Health:No Equipment/Devices:None  Discharge Condition:Hospice CODE STATUS:DNR Diet recommendation: Heart Healthy   Brief/Interim Summary: 71 y.o. 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 Oncology was consulted and recommended follow-up with them as an outpatient.   Significant studies: 02/03/2021 MRI of the brain showed 2 punctuate of fossae of possible artifactual. 02/02/2021 CT scan of the abdomen and pelvis that showed ascites, bilateral pleural effusion lesion in the pancreatic tail measuring about 3 x 2 cm highly concerning for pancreatic adenocarcinoma multiple lymphadenopathies at the celiac axis and hepatoduodenal ligament.  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 The family did not want chemotherapy. Palliative care was consulted and she would be going with hospice to her house.  And hospice will follow-up as an outpatient.  Acute metabolic encephalopathy: Likely due to narcotics use is resolved with holding her narcotics.  Abdominal pain: Likely due to pancreatic cancer continue fentanyl patch and Oxley orally.  Melanotic stools/GI bleed/acute blood loss anemia: Likely due to NSAIDs EGD was done by GI and showed gastritis with esophagitis with erosive gastritis with stigmata of bleeding. She was started on Protonix which should continue as an outpatient.  Hypothyroidism: No changes made to her medication.  Portal vein thrombosis: Confirmed by MRI after EGD, she was started on IV heparin and her hemoglobin remained stable. She was changed to oral Eliquis which she continue as an outpatient.  Essential hypertension: Changes made to her medication.  Acute urinary retention: Foley was placed temporarily she had a voiding trial and Foley was discontinued.  Constipation: She will continue lactulose and MiraLAX as an outpatient.  Discharge Instructions  Discharge Instructions     Diet - low sodium heart healthy   Complete by: As directed    Increase activity slowly   Complete by: As directed    No wound care   Complete by: As directed       Allergies as of 02/10/2021       Reactions   Amlodipine Swelling   Tramadol Hcl Other (See Comments)  Trazodone Hcl Other (See Comments)   Penicillins Rash, Other (See  Comments)   Rash and hot all over. Has tolerated Keflex        Medication List     TAKE these medications    amphetamine-dextroamphetamine 12.5 MG tablet Commonly known as: ADDERALL Take 1 tablet by mouth 2 (two) times daily.   apixaban 5 MG Tabs tablet Commonly known as: ELIQUIS Take 2 tablets (10 mg total) by mouth 2 (two) times daily.   bisacodyl 5 MG EC tablet Commonly known as: DULCOLAX Take 2 tablets (10 mg total) by mouth 2 (two) times daily.   Dulcolax 400 MG/5ML suspension Generic drug: magnesium hydroxide Take 15 mLs by mouth daily. For anal tear   fentaNYL 75 MCG/HR Commonly known as: Roosevelt 1 patch onto the skin every 3 (three) days. Start taking on: February 11, 2021   liothyronine 25 MCG tablet Commonly known as: CYTOMEL Take 12.5-25 mcg by mouth See admin instructions. Taking one tablet in the AM and 1/2 tablet at noon  or lunch time.   LORazepam 2 MG tablet Commonly known as: ATIVAN Take 2-4 mg by mouth at bedtime as needed for anxiety or sleep.   Magnesium 300 MG Caps Take 300 mg by mouth daily.   Melatonin 10 MG Tabs Take 50 mg by mouth at bedtime as needed (sleep).   metoprolol succinate 100 MG 24 hr tablet Commonly known as: TOPROL-XL 1 tablet   multivitamin capsule Take 1 capsule by mouth daily.   oxyCODONE-acetaminophen 10-325 MG tablet Commonly known as: PERCOCET Take 1 tablet by mouth every 6 (six) hours as needed for up to 5 days for pain.   prochlorperazine 5 MG tablet Commonly known as: COMPAZINE Take 1 tablet (5 mg total) by mouth every 6 (six) hours as needed for nausea or vomiting.   zolpidem 5 MG tablet Commonly known as: AMBIEN Take 1 tablet (5 mg total) by mouth at bedtime as needed for sleep.        Allergies  Allergen Reactions   Amlodipine Swelling   Tramadol Hcl Other (See Comments)   Trazodone Hcl Other (See Comments)   Penicillins Rash and Other (See Comments)    Rash and hot all over. Has  tolerated Keflex    Consultations: Palliative care Gastroenterology   Procedures/Studies: DG Abd 1 View  Result Date: 02/08/2021 CLINICAL DATA:  Per order: constipation. Pt reported continued abdominal pain due to tumors on her pancreas, liver, and spine. Pt stated her last bowel movement was a few days ago. EXAM: ABDOMEN - 1 VIEW COMPARISON:  02/02/2021 and older exams.  CT also dated 02/02/2021. FINDINGS: No bowel dilation to suggest obstruction. Colonic stool burden appears moderately increased. No convincing renal or ureteral stones. Soft tissues poorly defined but otherwise unremarkable. IMPRESSION: 1. No evidence of bowel obstruction.  No acute findings. 2. Moderate increase in the colonic stool burden. Electronically Signed   By: Lajean Manes M.D.   On: 02/08/2021 11:22   MR BRAIN W WO CONTRAST  Result Date: 02/03/2021 CLINICAL DATA:  Brain metastases suspected EXAM: MRI HEAD WITHOUT AND WITH CONTRAST TECHNIQUE: Multiplanar, multiecho pulse sequences of the brain and surrounding structures were obtained without and with intravenous contrast. CONTRAST:  59mL GADAVIST GADOBUTROL 1 MMOL/ML IV SOLN COMPARISON:  No prior MRI of the head. FINDINGS: Evaluation is somewhat limited by motion artifact. Brain: No restricted diffusion to suggest acute or subacute infarct. No acute hemorrhage, mass, mass effect, or midline shift. Possible focus of  enhancement in the right periventricular white matter (series 16, image 15 and series 17, image 10), which is not definitively seen on the axial postcontrast T1 and may be artifactual. Additional possible punctate lesion is seen in the left posterior frontal lobe periventricular white matter (series 15, image 30), although this lesion is not corroborated on coronal or sagittal sequences. No increased T2 signal is definitively associated with these possible lesions to suggest edema. Scattered T2 hyperintense signal in the periventricular white matter, likely the  sequela of mild chronic small vessel ischemic disease. No foci of hemosiderin deposition to suggest remote hemorrhage. Vascular: Normal flow voids. Skull and upper cervical spine: Normal marrow signal. Sinuses/Orbits: Postsurgical changes in the maxillary sinuses. No acute finding. Other: The mastoids are well aerated. IMPRESSION: Evaluation is somewhat limited by motion artifact. Within this limitation, there are 2 punctate foci of possible enhancement, although these do not appear consistently on all scan planes and no associated edema is seen, suggesting that these lesions are artifactual. Recommend close attention on follow-up. Electronically Signed   By: Merilyn Baba M.D.   On: 02/03/2021 22:46   MR ABDOMEN W WO CONTRAST  Result Date: 01/28/2021 CLINICAL DATA:  Pancreatic lesion seen on recent CT. EXAM: MRI ABDOMEN WITHOUT AND WITH CONTRAST TECHNIQUE: Multiplanar multisequence MR imaging of the abdomen was performed both before and after the administration of intravenous contrast. CONTRAST:  32mL GADAVIST GADOBUTROL 1 MMOL/ML IV SOLN COMPARISON:  CT scan 06/27/2021 FINDINGS: Lower chest: Unremarkable. Hepatobiliary: 1.7 cm subcapsular lesion identified inferior right liver, demonstrating restricted diffusion and increased T2 signal. After IV contrast administration there is irregular rim enhancement (axial arterial phase image 54/20 and coronal postcontrast 29/31). There is no evidence for gallstones, gallbladder wall thickening, or pericholecystic fluid. No intrahepatic or extrahepatic biliary dilation. Pancreas: As noted on recent CT scan, there is a hypoenhancing lesion in the tail of pancreas demonstrating rim enhancement today. Lesion measures 2.5 x 2.1 cm on postcontrast image 43 of series 24 and the lesion restricts diffusion. Main pancreatic duct appears to abruptly cut off at the margin of this lesion. Spleen:  Splenic volume upper normal.  No focal abnormality. Adrenals/Urinary Tract: No adrenal  nodule or mass. Tiny foci of non enhancement in both kidneys are too small to characterize but likely cyst. Stomach/Bowel: Stomach is unremarkable. No gastric wall thickening. No evidence of outlet obstruction. Duodenum is normally positioned as is the ligament of Treitz. No small bowel or colonic dilatation within the visualized abdomen. Vascular/Lymphatic: No abdominal aortic aneurysm. Ill-defined lymph nodes are seen in the hepatoduodenal ligament measuring up to 11 mm short axis diameter (axial T2 image 17 of series 8). Prominent ill-defined lymph node identified just to the right of the celiac axis on image 18/8 measures 1.6 cm short axis. All of these lymph nodes are strict diffusion on diffusion-weighted imaging. There is marked attenuation of the main portal vein just distal to the portal splenic confluence (axial postcontrast image 40 of series 24 and coronal postcontrast image 23 of series 31). As noted on previous CT, thrombus is identified in the splenic vein. Other:  No intraperitoneal free fluid. Musculoskeletal: 1.4 cm T1 hypointense, T2 isointense, rim enhancing lesion is identified in the lower thoracic spine at approximately T10. IMPRESSION: 1. 2.5 x 2.1 cm hypoenhancing lesion in the tail of pancreas with peripheral enhancement. Imaging features highly concerning for pancreatic adenocarcinoma. Endoscopic ultrasound would likely prove helpful to further evaluate. 2. Ill-defined lymph nodes in the hepatoduodenal ligament and just to  the right of the celiac axis. This lymphadenopathy is associated with marked attenuation of the main portal vein just distal to the portal splenic confluence, presumably secondary to mass-effect/vascular involvement by the lymphadenopathy 3. 1.7 cm subcapsular lesion identified inferior right liver with irregular rim enhancement. Imaging features highly concerning for metastatic disease. 4. 1.4 cm rim enhancing lesion in the lower thoracic spine at approximately T10.  Metastatic disease a concern. 5. As noted on prior CT, thrombus is identified in the splenic vein with splenic volume calculated at upper normal. Electronically Signed   By: Misty Stanley M.D.   On: 01/28/2021 13:15   CT ABDOMEN PELVIS W CONTRAST  Result Date: 02/02/2021 CLINICAL DATA:  Acute abdominal pain, suspected pancreatic tail mass EXAM: CT ABDOMEN AND PELVIS WITH CONTRAST TECHNIQUE: Multidetector CT imaging of the abdomen and pelvis was performed using the standard protocol following bolus administration of intravenous contrast. RADIATION DOSE REDUCTION: This exam was performed according to the departmental dose-optimization program which includes automated exposure control, adjustment of the mA and/or kV according to patient size and/or use of iterative reconstruction technique. CONTRAST:  127mL OMNIPAQUE IOHEXOL 300 MG/ML  SOLN COMPARISON:  CT abdomen pelvis, 01/27/2021, MR abdomen, 01/28/2021 FINDINGS: Lower chest: New moderate right, small left pleural effusions and associated atelectasis or consolidation. Hepatobiliary: No solid liver abnormality is seen. No gallstones, gallbladder wall thickening, or biliary dilatation. Pancreas: Unchanged hypodense lesion of the pancreatic tail, measuring approximately 2.8 x 1.9 cm (series 2, image 27). No pancreatic ductal dilatation or surrounding inflammatory changes. Spleen: Normal in size without significant abnormality. Adrenals/Urinary Tract: Adrenal glands are unremarkable. Kidneys are normal, without renal calculi, solid lesion, or hydronephrosis. Foley catheter in the urinary bladder. Stomach/Bowel: Stomach is within normal limits. No evidence of bowel wall thickening, distention, or inflammatory changes. Vascular/Lymphatic: Aortic atherosclerosis. Redemonstrated thrombus within the central splenic vein and at the confluence of the portal vein (series 2, image 28). The portal vein appears to be partially occluded by celiac axis and hepato duodenal  ligament adenopathy, with partial cavernous transformation (series 2, image 25). Largest lymph node conglomerate measures approximately 2.5 x 2.2 cm (series 2, image 25). Reproductive: No mass or other significant abnormality. Other: No abdominal wall hernia or abnormality. New anasarca. New small volume ascites throughout the abdomen and pelvis. Musculoskeletal: No acute or significant osseous findings. IMPRESSION: 1. New small volume ascites, bilateral pleural effusions, and anasarca. 2. Unchanged hypodense lesion of the pancreatic tail, measuring approximately 2.8 x 1.9 cm. This finding remains highly concerning for pancreatic adenocarcinoma. 3. Celiac axis and hepatoduodenal ligament lymphadenopathy, highly concerning for nodal metastatic disease. 4. Redemonstrated thrombus within the central splenic vein and at the confluence of the portal vein. 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 Aortic Atherosclerosis (ICD10-I70.0). Electronically Signed   By: Delanna Ahmadi M.D.   On: 02/02/2021 17:15   CT Abdomen Pelvis W Contrast  Result Date: 01/27/2021 CLINICAL DATA:  Abdominal pain. EXAM: CT ABDOMEN AND PELVIS WITH CONTRAST TECHNIQUE: Multidetector CT imaging of the abdomen and pelvis was performed using the standard protocol following bolus administration of intravenous contrast. RADIATION DOSE REDUCTION: This exam was performed according to the departmental dose-optimization program which includes automated exposure control, adjustment of the mA and/or kV according to patient size and/or use of iterative reconstruction technique. CONTRAST:  147mL OMNIPAQUE IOHEXOL 300 MG/ML  SOLN COMPARISON:  None. FINDINGS: Lower chest: No acute  abnormality. Hepatobiliary: No focal liver abnormality is seen. A 1.7 cm x 1.7 cm x 1.9 cm low-attenuation soft tissue mass is seen within the  periportal region (approximately 32.62 Hounsfield units). Mass effect on the portal vein is noted with subsequent marked severity portal vein narrowing (sagittal reformatted images 74 through 78, CT series 5). No gallstones, gallbladder wall thickening, or biliary dilatation. Pancreas: Adjacent 2.6 cm x 1.8 cm x 2.2 cm and 2.8 cm x 1.4 cm x 1.2 cm low-attenuation pancreatic masses are seen within the pancreatic tail (approximately 32.19 Hounsfield units). There is no evidence of pancreatic ductal dilatation. Spleen: Normal in size without focal abnormality. Adrenals/Urinary Tract: Adrenal glands are unremarkable. Kidneys are normal, without renal calculi, focal lesion, or hydronephrosis. Bladder is unremarkable. Stomach/Bowel: Stomach is within normal limits. Appendix appears normal. A large amount of stool is seen throughout the large bowel. This is most prominent within the sigmoid colon. No evidence of bowel wall thickening, distention, or inflammatory changes. Vascular/Lymphatic: Aortic atherosclerosis. Extensive intraluminal filling defects are seen within the splenic vein. No enlarged abdominal or pelvic lymph nodes. Reproductive: Uterus and bilateral adnexa are unremarkable. Other: No abdominal wall hernia or abnormality. No abdominopelvic ascites. Musculoskeletal: Marked severity degenerative changes are seen at the level of L5-S1. IMPRESSION: 1. Marked amount of splenic vein thrombus. 2. Pancreatic masses, as described above, worrisome for an underlying pancreatic neoplasm. MRI correlation is recommended. 3. Low attenuation periportal lesion concerning for metastatic disease with subsequent marked severity narrowing of the portal vein. 4. Aortic atherosclerosis. 5. Marked severity degenerative changes at the level of L5-S1. Aortic Atherosclerosis (ICD10-I70.0). Electronically Signed   By: Virgina Norfolk M.D.   On: 01/27/2021 20:22   MR 3D Recon At Scanner  Result Date: 01/28/2021 CLINICAL DATA:   Pancreatic lesion seen on recent CT. EXAM: MRI ABDOMEN WITHOUT AND WITH CONTRAST TECHNIQUE: Multiplanar multisequence MR imaging of the abdomen was performed both before and after the administration of intravenous contrast. CONTRAST:  24mL GADAVIST GADOBUTROL 1 MMOL/ML IV SOLN COMPARISON:  CT scan 06/27/2021 FINDINGS: Lower chest: Unremarkable. Hepatobiliary: 1.7 cm subcapsular lesion identified inferior right liver, demonstrating restricted diffusion and increased T2 signal. After IV contrast administration there is irregular rim enhancement (axial arterial phase image 54/20 and coronal postcontrast 29/31). There is no evidence for gallstones, gallbladder wall thickening, or pericholecystic fluid. No intrahepatic or extrahepatic biliary dilation. Pancreas: As noted on recent CT scan, there is a hypoenhancing lesion in the tail of pancreas demonstrating rim enhancement today. Lesion measures 2.5 x 2.1 cm on postcontrast image 43 of series 24 and the lesion restricts diffusion. Main pancreatic duct appears to abruptly cut off at the margin of this lesion. Spleen:  Splenic volume upper normal.  No focal abnormality. Adrenals/Urinary Tract: No adrenal nodule or mass. Tiny foci of non enhancement in both kidneys are too small to characterize but likely cyst. Stomach/Bowel: Stomach is unremarkable. No gastric wall thickening. No evidence of outlet obstruction. Duodenum is normally positioned as is the ligament of Treitz. No small bowel or colonic dilatation within the visualized abdomen. Vascular/Lymphatic: No abdominal aortic aneurysm. Ill-defined lymph nodes are seen in the hepatoduodenal ligament measuring up to 11 mm short axis diameter (axial T2 image 17 of series 8). Prominent ill-defined lymph node identified just to the right of the celiac axis on image 18/8 measures 1.6 cm short axis. All of these lymph nodes are strict diffusion on diffusion-weighted imaging. There is marked attenuation of the main portal vein  just distal to the  portal splenic confluence (axial postcontrast image 40 of series 24 and coronal postcontrast image 23 of series 31). As noted on previous CT, thrombus is identified in the splenic vein. Other:  No intraperitoneal free fluid. Musculoskeletal: 1.4 cm T1 hypointense, T2 isointense, rim enhancing lesion is identified in the lower thoracic spine at approximately T10. IMPRESSION: 1. 2.5 x 2.1 cm hypoenhancing lesion in the tail of pancreas with peripheral enhancement. Imaging features highly concerning for pancreatic adenocarcinoma. Endoscopic ultrasound would likely prove helpful to further evaluate. 2. Ill-defined lymph nodes in the hepatoduodenal ligament and just to the right of the celiac axis. This lymphadenopathy is associated with marked attenuation of the main portal vein just distal to the portal splenic confluence, presumably secondary to mass-effect/vascular involvement by the lymphadenopathy 3. 1.7 cm subcapsular lesion identified inferior right liver with irregular rim enhancement. Imaging features highly concerning for metastatic disease. 4. 1.4 cm rim enhancing lesion in the lower thoracic spine at approximately T10. Metastatic disease a concern. 5. As noted on prior CT, thrombus is identified in the splenic vein with splenic volume calculated at upper normal. Electronically Signed   By: Misty Stanley M.D.   On: 01/28/2021 13:15   US BIOPSY (LIVER)  Result Date: 01/30/2021 INDICATION: 71 year-old with a pancreatic tail mass and evidence for metastatic disease including a suspicious lesion in the inferior right hepatic lobe. EXAM: ULTRASOUND-GUIDED LIVER LESION BIOPSY MEDICATIONS: Moderate sedation ANESTHESIA/SEDATION: Moderate (conscious) sedation was employed during this procedure. A total of Versed 2.0mg  and fentanyl 100 mcg was administered intravenously at the order of the provider performing the procedure. Total intra-service moderate sedation time: 16 minutes. Patient's level  of consciousness and vital signs were monitored continuously by radiology nurse throughout the procedure under the supervision of the provider performing the procedure. FLUOROSCOPY TIME:  None COMPLICATIONS: None immediate. PROCEDURE: Informed written consent was obtained from the patient after a thorough discussion of the procedural risks, benefits and alternatives. All questions were addressed. Maximal Sterile Barrier Technique was utilized including caps, mask, sterile gowns, sterile gloves, sterile drape, hand hygiene and skin antiseptic. A timeout was performed prior to the initiation of the procedure. Right side of the abdomen was evaluated with ultrasound. Hypoechoic lesion in the inferior right hepatic lobe was identified and targeted. Skin was prepped with chlorhexidine and sterile field was created. Skin was anesthetized with 1% lidocaine and a small incision was made. Using ultrasound guidance, a 17 gauge coaxial needle was directed into the hypoechoic liver lesion. Three core biopsies were obtained with an 18 gauge core device. Specimens placed in formalin. Gel-Foam slurry was injected as the 17 gauge needle was removed. Bandage placed over the puncture site. FINDINGS: Hypoechoic lesion along the inferior right hepatic lobe near the capsule. Lesion measures 1.3 x 1.4 x 1.7 cm. This lesion corresponds with the abnormal lesion on recent MRI. Core biopsy needle was confirmed within the lesion. No immediate bleeding or hematoma formation. IMPRESSION: Ultrasound-guided core biopsy of the right hepatic lesion. Electronically Signed   By: Markus Daft M.D.   On: 01/30/2021 18:32   DG Abd Portable 1V  Result Date: 02/02/2021 CLINICAL DATA:  Abdominal pain and distention. EXAM: PORTABLE ABDOMEN - 1 VIEW COMPARISON:  Abdominal radiograph 02/08/2021. FINDINGS: Gaseous distended loops of small bowel within the central abdomen. Geographic lucencies involving the left upper quadrant and right lower quadrant.  Bibasilar atelectasis/scarring. Unremarkable osseous structures. IMPRESSION: Suggestion of dilated small bowel within the central abdomen. There are geographic lucencies within the left upper  quadrant and right lower quadrant. While this may represent stool, other etiology such as pneumatosis not excluded. Recommend further evaluation with CT. Electronically Signed   By: Lovey Newcomer M.D.   On: 02/02/2021 13:43   DG Abd Portable 1V  Result Date: 01/31/2021 CLINICAL DATA:  Abdominal distension EXAM: PORTABLE ABDOMEN - 1 VIEW COMPARISON:  None. FINDINGS: The bowel gas pattern is normal. No radio-opaque calculi or other significant radiographic abnormality are seen. IMPRESSION: Negative. Electronically Signed   By: Rolm Baptise M.D.   On: 01/31/2021 18:10    Subjective: No new complaints  Discharge Exam: Vitals:   02/09/21 2145 02/10/21 0505  BP: 135/89 (!) 141/84  Pulse: (!) 109 (!) 105  Resp: 18 18  Temp: 98.5 F (36.9 C) 97.9 F (36.6 C)  SpO2: 98% 99%   Vitals:   02/09/21 0528 02/09/21 1521 02/09/21 2145 02/10/21 0505  BP: 140/76 117/76 135/89 (!) 141/84  Pulse: (!) 107 97 (!) 109 (!) 105  Resp: 18 17 18 18   Temp: 98.3 F (36.8 C) 98 F (36.7 C) 98.5 F (36.9 C) 97.9 F (36.6 C)  TempSrc: Oral Oral Oral Oral  SpO2: 95% 97% 98% 99%  Weight:      Height:        General: Pt is alert, awake, not in acute distress Cardiovascular: RRR, S1/S2 +, no rubs, no gallops Respiratory: CTA bilaterally, no wheezing, no rhonchi Abdominal: Soft, NT, ND, bowel sounds + Extremities: no edema, no cyanosis    The results of significant diagnostics from this hospitalization (including imaging, microbiology, ancillary and laboratory) are listed below for reference.     Microbiology: Recent Results (from the past 240 hour(s))  MRSA Next Gen by PCR, Nasal     Status: None   Collection Time: 02/03/21  8:00 PM   Specimen: Nasal Mucosa; Nasal Swab  Result Value Ref Range Status   MRSA by PCR  Next Gen NOT DETECTED NOT DETECTED Final    Comment: (NOTE) The GeneXpert MRSA Assay (FDA approved for NASAL specimens only), is one component of a comprehensive MRSA colonization surveillance program. It is not intended to diagnose MRSA infection nor to guide or monitor treatment for MRSA infections. Test performance is not FDA approved in patients less than 71 years old. Performed at [facility], [place] [address], [place], [place] [address]      Labs: BNP (last 3 results) No results for input(s): BNP in the last 8760 hours. Basic Metabolic Panel: Recent Labs  Lab 02/04/21 0244 02/05/21 0547 02/06/21 0459 02/07/21 0518 02/08/21 0547  NA 136 138 133* 133* 130*  K 3.4* 3.5 3.4* 3.5 3.8  CL 104 109 110 107 102  CO2 24 21* 17* 19* 21*  GLUCOSE 118* 111* 111* 108* 125*  BUN 20 17 12 12 13   CREATININE 0.62 0.79 0.70 0.71 0.65  CALCIUM 8.4* 8.5* 7.8* 8.1* 8.2*  MG 2.3 2.0 1.6* 1.6* 2.0   Liver Function Tests: Recent Labs  Lab 02/04/21 0901  AST 29  ALT 24  ALKPHOS 53  BILITOT 0.6  PROT 5.1*  ALBUMIN 2.6*   No results for input(s): LIPASE, AMYLASE in the last 168 hours. Recent Labs  Lab 02/04/21 0901 02/05/21 0547 02/05/21 0848  AMMONIA 68* 37* 35   CBC: Recent Labs  Lab 02/03/21 1218 02/04/21 0244 02/04/21 1148 02/05/21 0547 02/05/21 1734 02/06/21 0459 02/06/21 1744 02/07/21 0518 02/07/21 1957  WBC 10.6* 8.6  --  8.0  --   --   --   --   --  NEUTROABS 8.2*  --   --   --   --   --   --   --   --   HGB 8.4* 7.7*   < > 7.6* 7.9* 6.8* 10.8* 9.8* 10.6*  HCT 26.1* 24.0*   < > 23.9* 24.3* 22.1* 33.7* 30.3* 33.3*  MCV 82.9 83.3  --  84.5  --   --   --   --   --   PLT 191 177  --  178  --   --   --   --   --    < > = values in this interval not displayed.   Cardiac Enzymes: No results for input(s): CKTOTAL, CKMB, CKMBINDEX, TROPONINI in the last 168 hours. BNP: Invalid input(s): POCBNP CBG: No results for input(s): GLUCAP in the last  168 hours. D-Dimer No results for input(s): DDIMER in the last 72 hours. Hgb A1c No results for input(s): HGBA1C in the last 72 hours. Lipid Profile No results for input(s): CHOL, HDL, LDLCALC, TRIG, CHOLHDL, LDLDIRECT in the last 72 hours. Thyroid function studies No results for input(s): TSH, T4TOTAL, T3FREE, THYROIDAB in the last 72 hours.  Invalid input(s): FREET3 Anemia work up No results for input(s): VITAMINB12, FOLATE, FERRITIN, TIBC, IRON, RETICCTPCT in the last 72 hours. Urinalysis    Component Value Date/Time   COLORURINE YELLOW 01/28/2021 0100   APPEARANCEUR HAZY (A) 01/28/2021 0100   LABSPEC >1.046 (H) 01/28/2021 0100   PHURINE 7.0 01/28/2021 0100   GLUCOSEU NEGATIVE 01/28/2021 0100   HGBUR NEGATIVE 01/28/2021 0100   BILIRUBINUR NEGATIVE 01/28/2021 0100   KETONESUR 5 (A) 01/28/2021 0100   PROTEINUR NEGATIVE 01/28/2021 0100   NITRITE NEGATIVE 01/28/2021 0100   LEUKOCYTESUR TRACE (A) 01/28/2021 0100   Sepsis Labs Invalid input(s): PROCALCITONIN,  WBC,  LACTICIDVEN Microbiology Recent Results (from the past 240 hour(s))  MRSA Next Gen by PCR, Nasal     Status: None   Collection Time: 02/03/21  8:00 PM   Specimen: Nasal Mucosa; Nasal Swab  Result Value Ref Range Status   MRSA by PCR Next Gen NOT DETECTED NOT DETECTED Final    Comment: (NOTE) The GeneXpert MRSA Assay (FDA approved for NASAL specimens only), is one component of a comprehensive MRSA colonization surveillance program. It is not intended to diagnose MRSA infection nor to guide or monitor treatment for MRSA infections. Test performance is not FDA approved in patients less than 8 years old. Performed at [facility], [place] [address] [place], [place] [address]      SIGNED:   Charlynne Cousins, MD  [facility] 02/10/2021, 8:55 AM Pager   If 7PM-7AM, please contact night-coverage [email or web address] Password [email or web address]

## 2021-02-10 NOTE — Progress Notes (Signed)
Pt has DC order. Pt has foley to go home with, verified with MD, leg bag was placed, new regular urinary bag was given to pt. Hospice was already set up per CM. All questions has been answered.

## 2021-02-11 ENCOUNTER — Telehealth (HOSPITAL_COMMUNITY): Payer: Self-pay | Admitting: *Deleted

## 2021-02-11 NOTE — Telephone Encounter (Signed)
Attempted to contact patient and family regarding post hospital discharge follow up.  No answer and was unable to leave a message.  Will continue to follow until contact has been made.

## 2021-02-12 ENCOUNTER — Encounter (HOSPITAL_COMMUNITY): Payer: Self-pay | Admitting: *Deleted

## 2021-02-12 NOTE — Progress Notes (Signed)
Gloria Ward daughter Gloria Ward was contacted by telephone to verify understanding of discharge instructions status post their most recent discharge from the hospital on the date:  02/10/21.  Verified that hospice is in place and they have been happy with the care that they are providing.  Asked if a paracentesis was a possible option for her, as she is having significant discomfort related to fluid.  Advised that the Dr overseeing her care would need to make that decision.  She will speak to hospice nurse during visit today.  Advised that follow up appointments with Dr Chryl Heck would be cancelled, due to hospice enrollment and that physician there will oversee her care.  Verbalized understating.   [name]'s questions were addressed to their satisfaction upon completion of this post discharge follow-up call for outpatient oncology.

## 2021-02-17 ENCOUNTER — Inpatient Hospital Stay: Payer: 59

## 2021-02-17 ENCOUNTER — Inpatient Hospital Stay: Payer: 59 | Admitting: Hematology and Oncology

## 2021-03-12 DEATH — deceased

## 2023-01-20 IMAGING — MR MR HEAD WO/W CM
12 of 13 series · 44 of 48 positions shown · IV contrast (gadavist)
Comparison: No prior MRI of the head.

CLINICAL DATA: Brain metastases suspected

EXAM:
MRI HEAD WITHOUT AND WITH CONTRAST
TECHNIQUE: Multiplanar, multiecho pulse sequences of the brain and surrounding
structures were obtained without and with intravenous contrast.
CONTRAST:  9mL GADAVIST GADOBUTROL 1 MMOL/ML IV SOLN

[Series 5: DWI · axial · 3.0mm · 1.36mm/px · z∈[-41,+84]mm · 9 of 96 slices shown (1 of 4)]
[im 1/96]
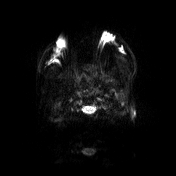
[im 12/96]
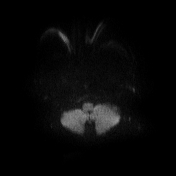
[im 24/96]
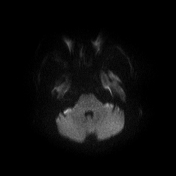
[im 36/96]
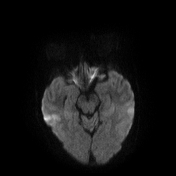
[im 48/96]
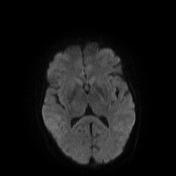
[im 60/96]
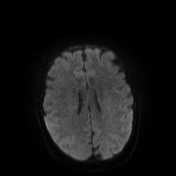
[im 72/96]
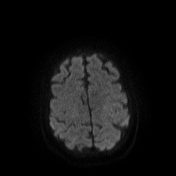
[im 84/96]
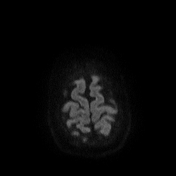
[im 96/96]
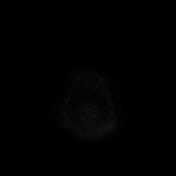

[Series 6: DWI · axial · 3.0mm · 1.36mm/px · z∈[-41,+84]mm · 5 of 48 slices shown (2 of 4)]
[im 1/48]
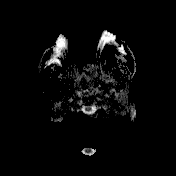
[im 12/48]
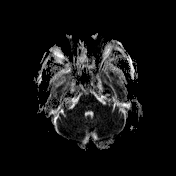
[im 24/48]
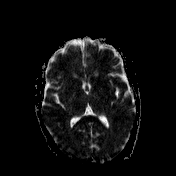
[im 36/48]
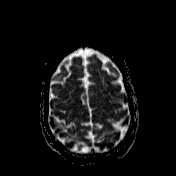
[im 48/48]
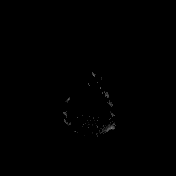

[Series 7: T2 · sagittal · 5.0mm · 0.47mm/px · 3 of 24 slices shown (1 of 2)]
[im 1/24]
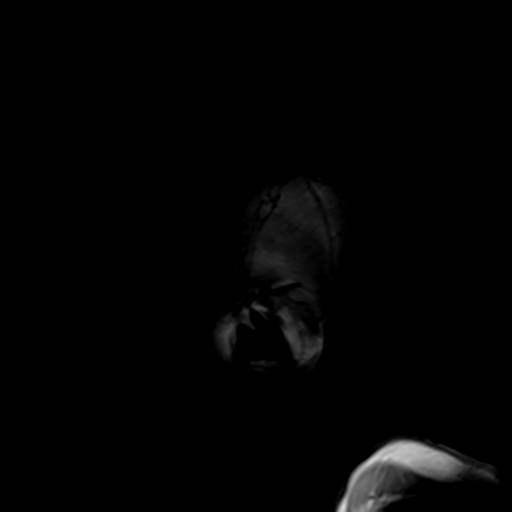
[im 12/24]
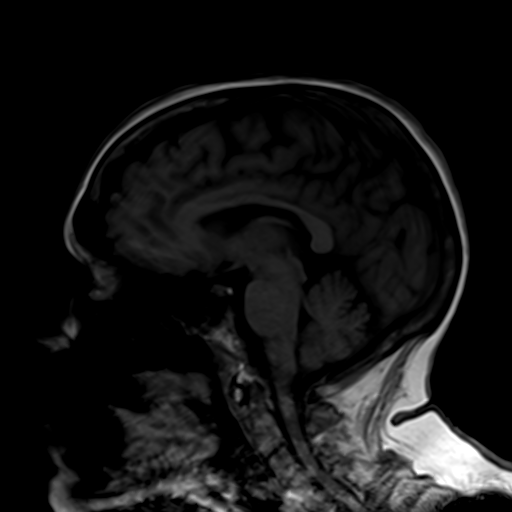
[im 24/24]
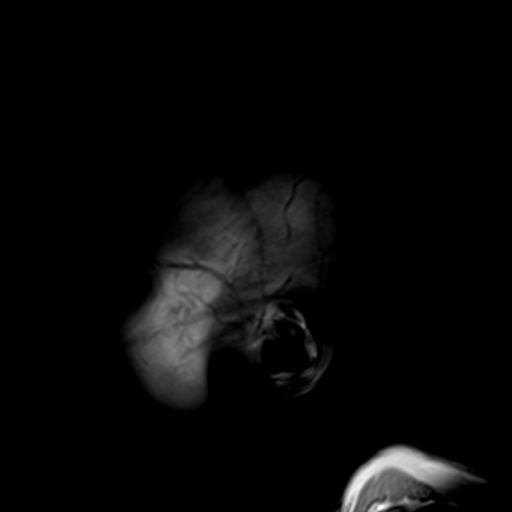

[Series 8: T2 · axial · 5.0mm · 0.45mm/px · z∈[-66,+75]mm · 2 of 25 slices shown (2 of 2)]
[im 1/25]
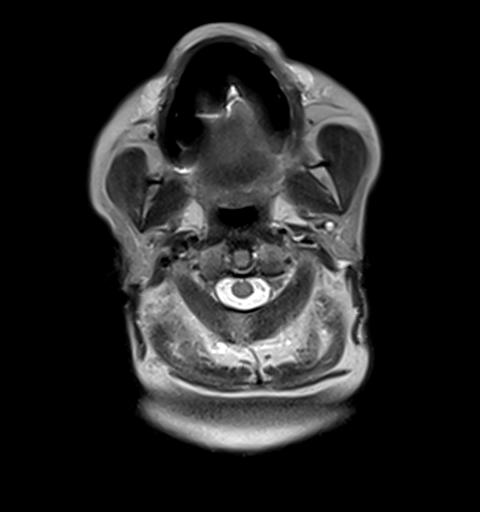
[im 25/25]
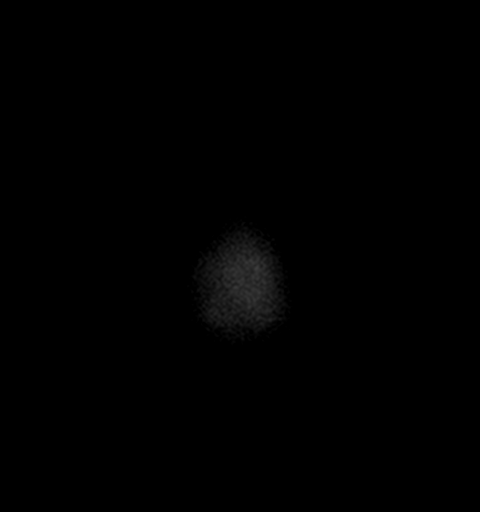

[Series 9: GRE · axial · 3.0mm · 0.45mm/px · z∈[-62,+73]mm · 4 of 51 slices shown]
[im 1/51]
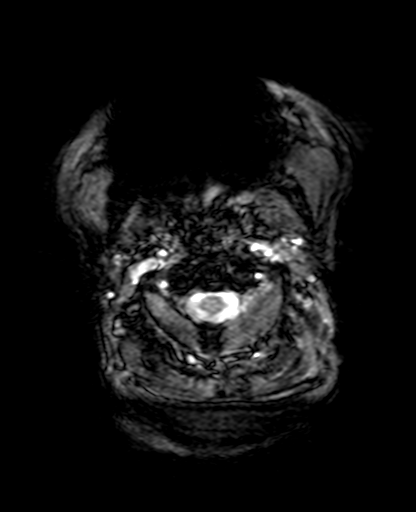
[im 17/51]
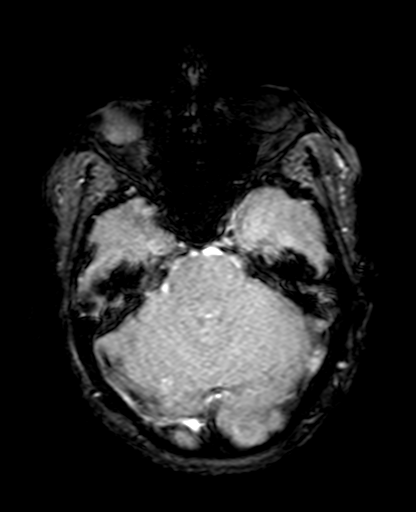
[im 34/51]
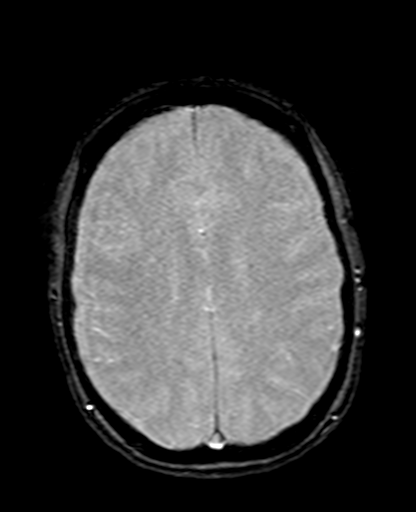
[im 51/51]
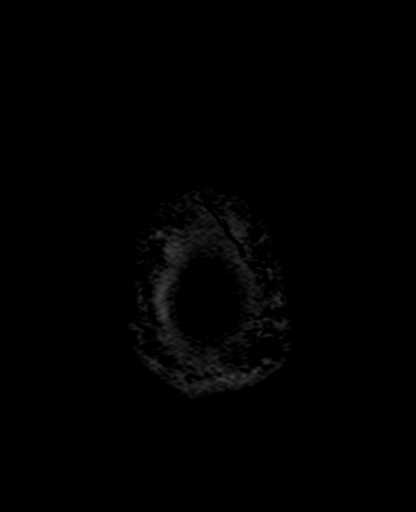

[Series 10: FLAIR · axial · 3.0mm · 0.86mm/px · z∈[-66,+70]mm · 4 of 51 slices shown]
[im 1/51]
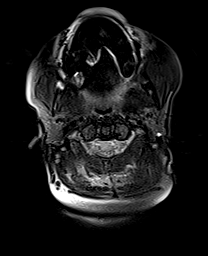
[im 17/51]
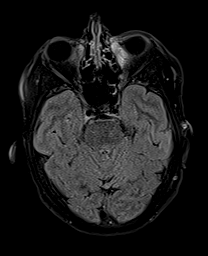
[im 34/51]
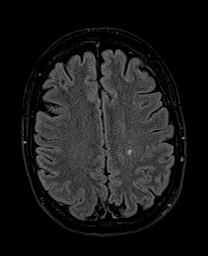
[im 51/51]
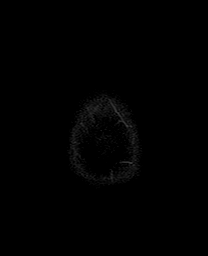

[Series 12: DWI · coronal · 5.0mm · 1.31mm/px · 5 of 56 slices shown (3 of 4)]
[im 1/56]
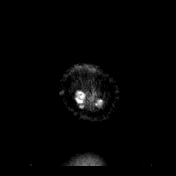
[im 14/56]
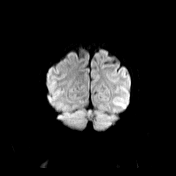
[im 28/56]
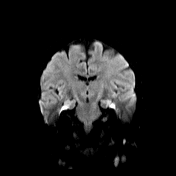
[im 42/56]
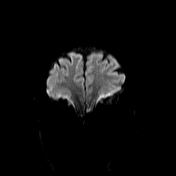
[im 56/56]
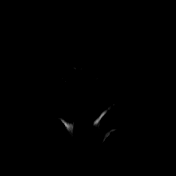

[Series 13: DWI · coronal · 5.0mm · 1.31mm/px · 2 of 28 slices shown (4 of 4)]
[im 1/28]
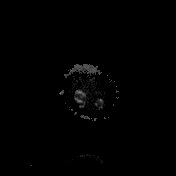
[im 28/28]
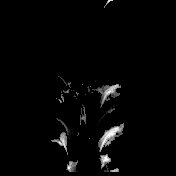

[Series 14: T2 post-contrast · coronal · 5.0mm · 0.86mm/px · 2 of 28 slices shown]
[im 1/28]
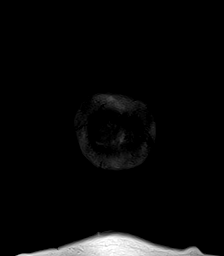
[im 28/28]
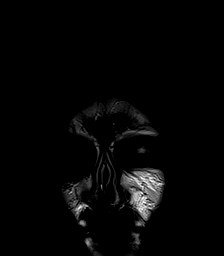

[Series 15: T1 post-contrast · axial · 3.0mm · 0.45mm/px · z∈[-57,+79]mm · 4 of 51 slices shown (1 of 3)]
[im 1/51]
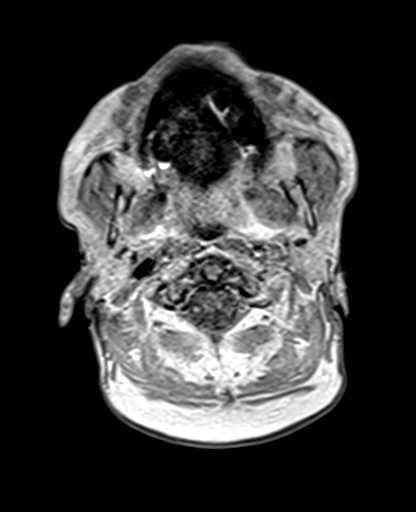
[im 17/51]
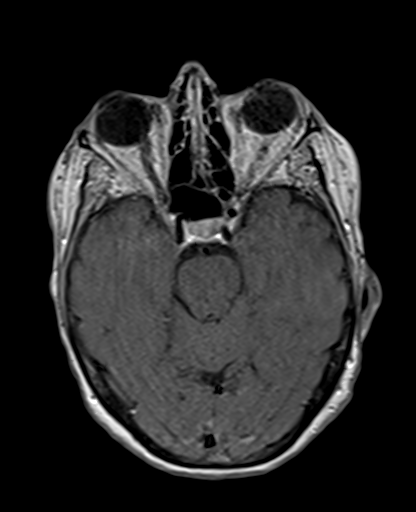
[im 34/51]
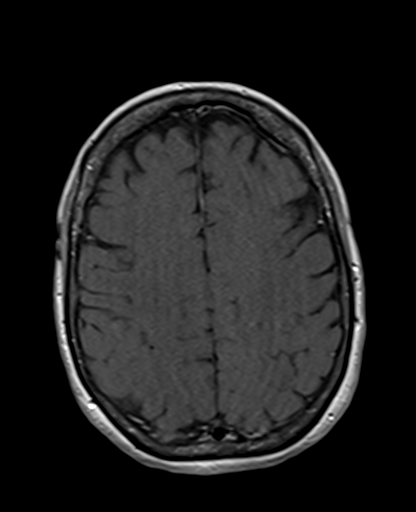
[im 51/51]
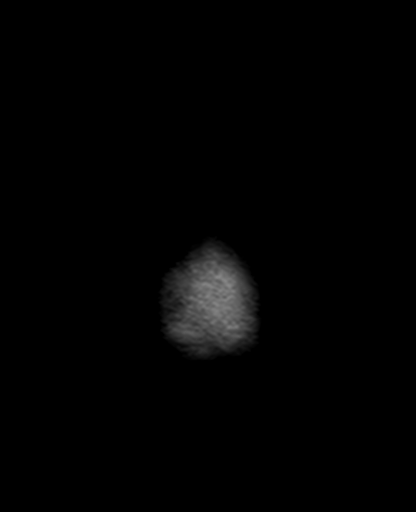

[Series 16: T1 post-contrast · coronal · 5.0mm · 0.43mm/px · 2 of 28 slices shown (2 of 3)]
[im 1/28]
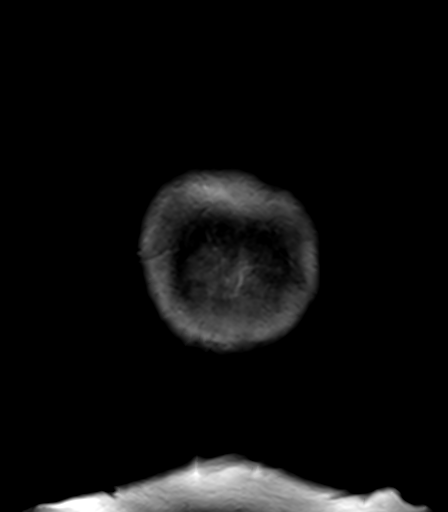
[im 28/28]
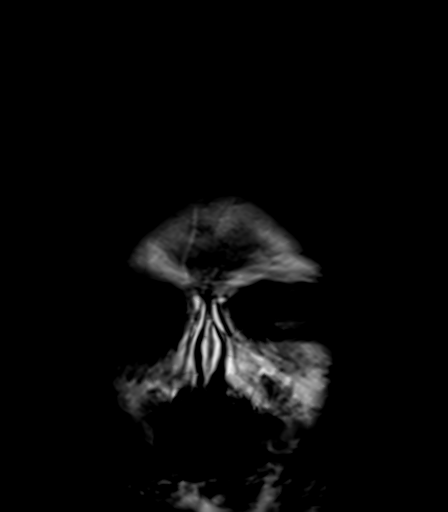

[Series 17: T1 post-contrast · sagittal · 5.0mm · 0.94mm/px · 2 of 24 slices shown (3 of 3)]
[im 1/24]
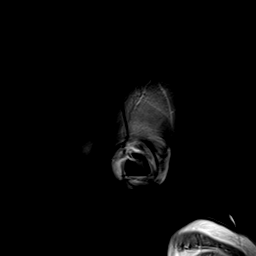
[im 24/24]
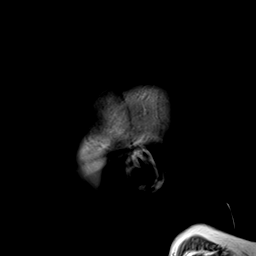

[44 of 48 positions shown; findings below may reference images not displayed]

FINDINGS: Evaluation is somewhat limited by motion artifact.

Brain: No restricted diffusion to suggest acute or subacute infarct.
No acute hemorrhage, mass, mass effect, or midline shift. Possible
focus of enhancement in the right periventricular white matter
(series 16, image 15 and series 17, image 10), which is not
definitively seen on the axial postcontrast T1 and may be
artifactual. Additional possible punctate lesion is seen in the left
posterior frontal lobe periventricular white matter (series 15,
image 30), although this lesion is not corroborated on coronal or
sagittal sequences. No increased T2 signal is definitively
associated with these possible lesions to suggest edema. Scattered
T2 hyperintense signal in the periventricular white matter, likely
the sequela of mild chronic small vessel ischemic disease. No foci
of hemosiderin deposition to suggest remote hemorrhage.

Vascular: Normal flow voids.

Skull and upper cervical spine: Normal marrow signal.

Sinuses/Orbits: Postsurgical changes in the maxillary sinuses. No
acute finding.

Other: The mastoids are well aerated.
IMPRESSION: Evaluation is somewhat limited by motion artifact. Within this
limitation, there are 2 punctate foci of possible enhancement,
although these do not appear consistently on all scan planes and no
associated edema is seen, suggesting that these lesions are
artifactual. Recommend close attention on follow-up.
